# Patient Record
Sex: Female | Born: 1985 | Race: White | Hispanic: No | Marital: Married | Smoking: Never smoker
Health system: Southern US, Community
[De-identification: ages and names within clinical notes are randomized; demographics above are authoritative.]

## PROBLEM LIST (undated history)

## (undated) DIAGNOSIS — F32A Depression, unspecified: Secondary | ICD-10-CM

## (undated) DIAGNOSIS — E039 Hypothyroidism, unspecified: Secondary | ICD-10-CM

## (undated) DIAGNOSIS — F329 Major depressive disorder, single episode, unspecified: Secondary | ICD-10-CM

## (undated) DIAGNOSIS — F419 Anxiety disorder, unspecified: Secondary | ICD-10-CM

## (undated) HISTORY — DX: Hypothyroidism, unspecified: E03.9

## (undated) HISTORY — PX: APPENDECTOMY: SHX54

## (undated) HISTORY — DX: Morbid (severe) obesity due to excess calories: E66.01

---

## 2007-05-20 ENCOUNTER — Encounter: Payer: Self-pay | Admitting: Family Medicine

## 2007-06-17 ENCOUNTER — Ambulatory Visit: Payer: Self-pay | Admitting: Family Medicine

## 2007-06-17 DIAGNOSIS — E039 Hypothyroidism, unspecified: Secondary | ICD-10-CM

## 2007-07-06 ENCOUNTER — Ambulatory Visit: Payer: Self-pay | Admitting: Family Medicine

## 2007-07-07 ENCOUNTER — Encounter (INDEPENDENT_AMBULATORY_CARE_PROVIDER_SITE_OTHER): Payer: Self-pay | Admitting: *Deleted

## 2007-07-07 LAB — CONVERTED CEMR LAB
BUN: 11 mg/dL (ref 6–23)
CO2: 24 meq/L (ref 19–32)
Calcium: 9.2 mg/dL (ref 8.4–10.5)
Creatinine, Ser: 0.8 mg/dL (ref 0.4–1.2)
Direct LDL: 162.9 mg/dL
T3, Free: 4 pg/mL (ref 2.3–4.2)
TSH: 2.62 microintl units/mL (ref 0.35–5.50)

## 2007-07-12 ENCOUNTER — Telehealth (INDEPENDENT_AMBULATORY_CARE_PROVIDER_SITE_OTHER): Payer: Self-pay | Admitting: *Deleted

## 2007-07-13 ENCOUNTER — Ambulatory Visit: Payer: Self-pay | Admitting: Family Medicine

## 2007-07-15 LAB — CONVERTED CEMR LAB: Hgb A1c MFr Bld: 4.8 % (ref 4.6–6.0)

## 2008-07-23 ENCOUNTER — Telehealth (INDEPENDENT_AMBULATORY_CARE_PROVIDER_SITE_OTHER): Payer: Self-pay | Admitting: *Deleted

## 2008-07-31 ENCOUNTER — Ambulatory Visit: Payer: Self-pay | Admitting: Family Medicine

## 2008-08-08 ENCOUNTER — Ambulatory Visit: Payer: Self-pay | Admitting: Family Medicine

## 2008-08-08 LAB — CONVERTED CEMR LAB
Basophils Absolute: 0.1 10*3/uL (ref 0.0–0.1)
Basophils Relative: 0.8 % (ref 0.0–3.0)
CO2: 25 meq/L (ref 19–32)
Calcium: 8.6 mg/dL (ref 8.4–10.5)
Cholesterol: 180 mg/dL (ref 0–200)
Creatinine, Ser: 0.7 mg/dL (ref 0.4–1.2)
Eosinophils Absolute: 0.3 10*3/uL (ref 0.0–0.7)
Free T4: 0.9 ng/dL (ref 0.6–1.6)
Hemoglobin: 14.5 g/dL (ref 12.0–15.0)
Lymphocytes Relative: 41.7 % (ref 12.0–46.0)
MCHC: 35.4 g/dL (ref 30.0–36.0)
MCV: 86.4 fL (ref 78.0–100.0)
Neutro Abs: 4.3 10*3/uL (ref 1.4–7.7)
RBC: 4.75 M/uL (ref 3.87–5.11)

## 2008-10-10 ENCOUNTER — Ambulatory Visit: Payer: Self-pay | Admitting: Family Medicine

## 2008-10-12 ENCOUNTER — Ambulatory Visit: Payer: Self-pay | Admitting: Family Medicine

## 2008-10-23 ENCOUNTER — Encounter (INDEPENDENT_AMBULATORY_CARE_PROVIDER_SITE_OTHER): Payer: Self-pay | Admitting: *Deleted

## 2008-11-09 ENCOUNTER — Telehealth (INDEPENDENT_AMBULATORY_CARE_PROVIDER_SITE_OTHER): Payer: Self-pay | Admitting: *Deleted

## 2009-06-12 LAB — CONVERTED CEMR LAB: Pap Smear: NORMAL

## 2009-11-20 ENCOUNTER — Telehealth (INDEPENDENT_AMBULATORY_CARE_PROVIDER_SITE_OTHER): Payer: Self-pay | Admitting: *Deleted

## 2009-12-16 ENCOUNTER — Ambulatory Visit: Payer: Self-pay | Admitting: Family Medicine

## 2009-12-16 DIAGNOSIS — D239 Other benign neoplasm of skin, unspecified: Secondary | ICD-10-CM | POA: Insufficient documentation

## 2009-12-23 ENCOUNTER — Ambulatory Visit: Payer: Self-pay | Admitting: Family Medicine

## 2009-12-23 LAB — CONVERTED CEMR LAB
Bilirubin Urine: NEGATIVE
Glucose, Urine, Semiquant: NEGATIVE
Ketones, urine, test strip: NEGATIVE
Nitrite: NEGATIVE
Specific Gravity, Urine: <1.005
pH: 7

## 2009-12-24 LAB — CONVERTED CEMR LAB
Alkaline Phosphatase: 59 units/L (ref 39–117)
BUN: 8 mg/dL (ref 6–23)
Basophils Absolute: 0.1 10*3/uL (ref 0.0–0.1)
Bilirubin, Direct: 0 mg/dL (ref 0.0–0.3)
CO2: 26 meq/L (ref 19–32)
Chloride: 105 meq/L (ref 96–112)
Free T4: 1.3 ng/dL (ref 0.6–1.6)
Glucose, Bld: 74 mg/dL (ref 70–99)
HCT: 42.8 % (ref 36.0–46.0)
Hemoglobin: 14.9 g/dL (ref 12.0–15.0)
Lymphs Abs: 2.9 10*3/uL (ref 0.7–4.0)
MCV: 86.8 fL (ref 78.0–100.0)
Monocytes Absolute: 0.7 10*3/uL (ref 0.1–1.0)
Neutro Abs: 5.7 10*3/uL (ref 1.4–7.7)
Platelets: 406 10*3/uL — ABNORMAL HIGH (ref 150.0–400.0)
Potassium: 3.7 meq/L (ref 3.5–5.1)
RDW: 13 % (ref 11.5–14.6)
Total Bilirubin: 0.6 mg/dL (ref 0.3–1.2)

## 2009-12-25 ENCOUNTER — Encounter: Payer: Self-pay | Admitting: Family Medicine

## 2010-01-06 ENCOUNTER — Ambulatory Visit: Payer: Self-pay | Admitting: Family Medicine

## 2010-01-06 DIAGNOSIS — E785 Hyperlipidemia, unspecified: Secondary | ICD-10-CM

## 2010-04-08 ENCOUNTER — Ambulatory Visit: Payer: Self-pay | Admitting: Family Medicine

## 2010-04-09 LAB — CONVERTED CEMR LAB
ALT: 17 units/L (ref 0–35)
Albumin: 3.6 g/dL (ref 3.5–5.2)
Alkaline Phosphatase: 58 units/L (ref 39–117)
Total Protein: 6.7 g/dL (ref 6.0–8.3)

## 2010-04-22 ENCOUNTER — Telehealth (INDEPENDENT_AMBULATORY_CARE_PROVIDER_SITE_OTHER): Payer: Self-pay | Admitting: *Deleted

## 2010-04-24 ENCOUNTER — Telehealth: Payer: Self-pay | Admitting: Family Medicine

## 2010-08-20 ENCOUNTER — Ambulatory Visit: Payer: Self-pay | Admitting: Family Medicine

## 2010-08-20 ENCOUNTER — Encounter: Payer: Self-pay | Admitting: Family Medicine

## 2010-08-26 ENCOUNTER — Telehealth (INDEPENDENT_AMBULATORY_CARE_PROVIDER_SITE_OTHER): Payer: Self-pay | Admitting: *Deleted

## 2010-09-05 ENCOUNTER — Telehealth (INDEPENDENT_AMBULATORY_CARE_PROVIDER_SITE_OTHER): Payer: Self-pay | Admitting: *Deleted

## 2010-09-30 NOTE — Assessment & Plan Note (Signed)
Summary: roa for lab work results//lch   Vital Signs:  Patient profile:   25 year old female Weight:      250 pounds Pulse rate:   76 / minute Pulse rhythm:   regular BP sitting:   122 / 86  (left arm) Cuff size:   large  Vitals Entered By: Army Fossa CMA (Jan 06, 2010 4:33 PM) CC: Pt here to discuss NMR   History of Present Illness: Pt here to discuss NMR only.  No complaints.   Current Medications (verified): 1)  Synthroid 112 Mcg Tabs (Levothyroxine Sodium) .[name].. 1 By Mouth Once Daily 2)  Mircette 0.15-0.02/0.01 Mg (21/5)  Tabs (Desogestrel-Ethinyl Estradiol) .... As Directed 3)  Zocor 40 Mg Tabs (Simvastatin) .[name].. 1 By Mouth At Bedtime  Allergies: 1)  ! Lidocaine  Past History:  Past medical, surgical, family and social histories (including risk factors) reviewed for relevance to current acute and chronic problems.  Past Medical History: Hypothyroidism Hyperlipidemia  Past Surgical History: Reviewed history from 06/17/2007 and no changes required. Appendectomy: AGE 1   Family History: Reviewed history from 06/17/2007 and no changes required. MOTHER;LIVING FATHER:LIVING 2 BROTHERS:LIVING HEART: UNCLE (FATHER'S SIDE) Family History Hypertension: MOTHER, FATHER THROAT CANCER: PGM  Social History: Reviewed history from 06/17/2007 and no changes required. Occupation: Product/process development scientist- [employer] Married Never Smoked Alcohol use-yes Drug use-no Regular exercise-yes  Review of Systems      See HPI  Physical Exam  General:  Well-developed,well-nourished,in no acute distress; alert,appropriate and cooperative throughout examination Psych:  Oriented X3 and normally interactive.     Impression & Recommendations:  Problem # 1:  HYPERLIPIDEMIA (ICD-272.4) NMR reviewed with pt  Her updated medication list for this problem includes:    Zocor 40 Mg Tabs (Simvastatin) .[name]... 1 by mouth at bedtime  Labs Reviewed: SGOT: 23 (12/23/2009)   SGPT: 23 (12/23/2009)  HDL:31.3 (07/31/2008), 30.5 (07/06/2007)  LDL:116 (07/31/2008), DEL (07/06/2007)  Chol:180 (07/31/2008), 206 (07/06/2007)  Trig:162 (07/31/2008), 122 (07/06/2007)  Complete Medication List: 1)  Synthroid 112 Mcg Tabs (Levothyroxine sodium) .[name].. 1 by mouth once daily 2)  Mircette 0.15-0.02/0.01 Mg (21/5) Tabs (Desogestrel-ethinyl estradiol) .... As directed 3)  Zocor 40 Mg Tabs (Simvastatin) .[name].. 1 by mouth at bedtime  Patient Instructions: 1)  fasting labs 3 months----NMR, hep 272.4 Prescriptions: ZOCOR 40 MG TABS (SIMVASTATIN) 1 by mouth at bedtime  #30 x 2   Entered and Authorized by:   Loreen Freud DO   Signed by:   Loreen Freud DO on 01/06/2010   Method used:   Electronically to        [facility]. # [phone]* (retail)       [address]       [place], [place]  [address]       Ph: [phone]       Fax: [phone]   RxID:   [id number]

## 2010-09-30 NOTE — Assessment & Plan Note (Signed)
Summary: cpx//pt will be fasting//lch   Vital Signs:  Patient profile:   25 year old female Height:      68.75 inches Weight:      251 pounds BMI:     37.47 Pulse rate:   76 / minute Pulse rhythm:   regular BP sitting:   130 / 82  (left arm) Cuff size:   large  Vitals Entered By: Army Fossa CMA (December 16, 2009 8:50 AM) CC: Pt here for CPX- no pap has a gyn. Pt is not fasting.   History of Present Illness: Pt here for cpe --- no pap-- pt has gyn.  Pt was told by gyn that by next year if she has not lost any weight they will stop bcp.     Preventive Screening-Counseling & Management  Alcohol-Tobacco     Alcohol drinks/day: <1     Smoking Status: never  Caffeine-Diet-Exercise     Caffeine use/day: 2     Diet Comments: working on      Does Patient Exercise: yes     Type of exercise: aerobic     Exercise (avg: min/session): 30-60     Times/week: <3     Exercise Counseling: to improve exercise regimen  Hep-HIV-STD-Contraception     HIV Risk: no     Dental Visit-last 6 months yes     Dental Care Counseling: to seek dental care; no dental care within six months     SBE monthly: yes     SBE Education/Counseling: not indicated; SBE done regularly  Safety-Violence-Falls     Seat Belt Use: 100      Sexual History:  currently monogamous and married.    Current Medications (verified): 1)  Synthroid 112 Mcg Tabs (Levothyroxine Sodium) .[name].. 1 By Mouth Once Daily 2)  Mircette 0.15-0.02/0.01 Mg (21/5)  Tabs (Desogestrel-Ethinyl Estradiol) .... As Directed  Allergies: 1)  ! Lidocaine  Past History:  Past Medical History: Last updated: 06/17/2007 Hypothyroidism  Past Surgical History: Last updated: 06/17/2007 Appendectomy: AGE 89   Family History: Last updated: 06/17/2007 MOTHER;LIVING FATHER:LIVING 2 BROTHERS:LIVING HEART: UNCLE (FATHER'S SIDE) Family History Hypertension: MOTHER, FATHER THROAT CANCER: PGM  Social History: Last updated:  06/17/2007 Occupation: auditor- [employer] Married Never Smoked Alcohol use-yes Drug use-no Regular exercise-yes  Risk Factors: Alcohol Use: <1 (12/16/2009) Caffeine Use: 2 (12/16/2009) Diet: working on  (12/16/2009) Exercise: yes (12/16/2009)  Risk Factors: Smoking Status: never (12/16/2009) Passive Smoke Exposure: no (06/17/2007)  Family History: Reviewed history from 06/17/2007 and no changes required. MOTHER;LIVING FATHER:LIVING 2 BROTHERS:LIVING HEART: UNCLE (FATHER'S SIDE) Family History Hypertension: MOTHER, FATHER THROAT CANCER: PGM  Social History: Reviewed history from 06/17/2007 and no changes required. 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 Eyes:  Denies blurring, discharge, double vision, eye irritation, eye pain, halos, itching, light sensitivity, red eye, vision loss-1 eye, and vision loss-both eyes; optho--q2y. ENT:  Denies decreased hearing, difficulty swallowing, ear discharge, earache, hoarseness, nasal congestion, nosebleeds, postnasal drainage, ringing in ears, sinus pressure, and sore throat. CV:  Denies bluish discoloration of lips or nails, chest pain or discomfort, difficulty breathing at night, difficulty breathing while lying down, fainting, fatigue, leg cramps with exertion, lightheadness, near fainting, palpitations, shortness of breath with exertion, swelling  of feet, swelling of hands, and weight gain. Resp:  Denies chest discomfort, chest pain with inspiration, cough, coughing up blood, excessive snoring, hypersomnolence, morning headaches, pleuritic, shortness of breath, sputum productive, and wheezing. GI:  Denies abdominal pain, bloody stools, change in  bowel habits, constipation, dark tarry stools, diarrhea, excessive appetite, gas, hemorrhoids, indigestion, loss of appetite, nausea, vomiting, vomiting blood, and yellowish skin color. GU:  Denies abnormal vaginal bleeding, decreased libido, discharge, dysuria, genital sores, hematuria, incontinence, nocturia, urinary frequency, and urinary hesitancy. MS:  Denies joint pain, joint redness, joint swelling, loss of strength, low back pain, mid back pain, muscle aches, muscle , cramps, muscle weakness, stiffness, and thoracic pain. Derm:  Denies changes in color of skin, changes in nail beds, dryness, excessive perspiration, flushing, hair loss, insect bite(s), itching, lesion(s), poor wound healing, and rash. Neuro:  Denies brief paralysis, difficulty with concentration, disturbances in coordination, falling down, headaches, inability to speak, memory loss, numbness, poor balance, seizures, sensation of room spinning, tingling, tremors, visual disturbances, and weakness. Psych:  Denies alternate hallucination ( auditory/visual), anxiety, depression, easily angered, easily tearful, irritability, mental problems, panic attacks, sense of great danger, suicidal thoughts/plans, thoughts of violence, unusual visions or sounds, and thoughts /plans of harming others. Endo:  Denies cold intolerance, excessive hunger, excessive thirst, excessive urination, heat intolerance, polyuria, and weight change. Heme:  Denies abnormal bruising, bleeding, enlarge lymph nodes, fevers, pallor, and skin discoloration. Allergy:  Denies hives or rash, itching eyes, persistent infections, seasonal allergies, and sneezing.  Physical Exam  General:  Well-developed,well-nourished,in no acute distress; alert,appropriate and cooperative throughout examination overweight-appearing.   Head:  Normocephalic and atraumatic without obvious abnormalities. No apparent alopecia or balding. Eyes:  vision grossly intact, pupils equal, pupils  round, pupils reactive to light, and no injection.   Ears:  External ear exam shows no significant lesions or deformities.  Otoscopic examination reveals clear canals, tympanic membranes are intact bilaterally without bulging, retraction, inflammation or discharge. Hearing is grossly normal bilaterally. Nose:  External nasal examination shows no deformity or inflammation. Nasal mucosa are pink and moist without lesions or exudates. Mouth:  Oral mucosa and oropharynx without lesions or exudates.  Teeth in good repair. Neck:  No deformities, masses, or tenderness noted. Breasts:  gyn Lungs:  Normal respiratory effort, chest expands symmetrically. Lungs are clear to auscultation, no crackles or wheezes. Heart:  normal rate and no murmur.   Abdomen:  Bowel sounds positive,abdomen soft and non-tender without masses, organomegaly or hernias noted. Genitalia:  gyn Msk:  normal ROM, no joint tenderness, no joint swelling, no joint warmth, no redness over joints, no joint deformities, no joint instability, and no crepitation.   Pulses:  R popliteal normal, R posterior tibial normal, R dorsalis pedis normal, L posterior tibial normal, L dorsalis pedis normal, and L carotid normal.   Extremities:  No clubbing, cyanosis, edema, or deformity noted with normal full range of motion of all joints.   Neurologic:  No cranial nerve deficits noted. Station and gait are normal. Plantar reflexes are down-going bilaterally. DTRs are symmetrical throughout. Sensory, motor and coordinative functions appear intact. Skin:  Intact without suspicious lesions or rashes--- mult moles Cervical Nodes:  No lymphadenopathy noted Axillary Nodes:  No palpable lymphadenopathy Psych:  Cognition and judgment appear intact. Alert and cooperative with normal attention span and concentration. No apparent delusions, illusions, hallucinations   Impression & Recommendations:  Problem # 1:  PREVENTIVE HEALTH CARE (ICD-V70.0) GHM utd check  fasting labs pap per gyn  Problem # 2:  MORBID OBESITY (ICD-278.01) Encourage diet and exercise refer to nutrition Ht: 68.75 (12/16/2009)   Wt: 251 (12/16/2009)   BMI: 37.47 (12/16/2009)  Problem # 3:  NEVI, MULTIPLE (ICD-216.9)  Orders: Dermatology Referral (Derma)  Problem # 4:  HYPOTHYROIDISM (ICD-244.9)  Her updated medication list for this problem includes:    Synthroid 112 Mcg Tabs (Levothyroxine sodium) .[name]... 1 by mouth once daily  Labs Reviewed: TSH: 3.11 (10/12/2008)    HgBA1c: 4.8 (07/13/2007) Chol: 180 (07/31/2008)   HDL: 31.3 (07/31/2008)   LDL: 116 (07/31/2008)   TG: 162 (07/31/2008)  Complete Medication List: 1)  Synthroid 112 Mcg Tabs (Levothyroxine sodium) .[name].. 1 by mouth once daily 2)  Mircette 0.15-0.02/0.01 Mg (21/5) Tabs (Desogestrel-ethinyl estradiol) .... As directed  Other Orders: Nutrition Referral (Nutrition)  Patient Instructions: 1)  V70.0 244.9   hep, NMR, bmp, TSH,  CBCD  free T3,  Free T4--fasting, UA Prescriptions: SYNTHROID 112 MCG TABS (LEVOTHYROXINE SODIUM) 1 by mouth once daily  #30 x 11   Entered and Authorized by:   Loreen Freud DO   Signed by:   Loreen Freud DO on 12/16/2009   Method used:   Print then Give to Patient   RxID:   [id number]     Flu Vaccine Next Due:  Refused Last PAP:  Normal (05/11/2007 2:54:19 PM) PAP Result Date:  06/12/2009 PAP Result:  normal PAP Next Due:  1 yr

## 2010-09-30 NOTE — Consult Note (Signed)
Summary: Duard Larsen MD Dermatology  Duard Larsen MD Dermatology   Imported By: Lanelle Bal 02/21/2010 13:28:13  _____________________________________________________________________  External Attachment:    Type:   Image     Comment:   External Document

## 2010-09-30 NOTE — Progress Notes (Signed)
Summary: Refill Request  Phone Note Refill Request Message from:  Patient on [facility] on [address] in [place]   Refills Requested: Medication #1:  SYNTHROID 112 MCG TABS 1 by mouth once daily   Dosage confirmed as above?Dosage Confirmed   Supply Requested: 1 month Patient just needs a month refill until she come in for cpx in april.  Next Appointment Scheduled: 4.18.11 Initial call taken by: Harold Barban,  November 20, 2009 8:19 AM    Prescriptions: SYNTHROID 112 MCG TABS (LEVOTHYROXINE SODIUM) 1 by mouth once daily  #30 x 0   Entered by:   Army Fossa CMA   Authorized by:   Loreen Freud DO   Signed by:   Army Fossa CMA on 11/20/2009   Method used:   Electronically to        [facility]. # [phone]* (retail)       [address]       [place], [place]  [address]       Ph: [phone]       Fax: [phone]   RxID:   [id number]

## 2010-09-30 NOTE — Progress Notes (Signed)
Summary: lab result  Phone Note Outgoing Call   Call placed by: [facility] CMA,  April 22, 2010 8:50 AM Details for Reason: Small LDL particles high---con't zocor and add trilipix135 mg #30  1 by mouth once daily , 2 refills---give savings card with rx.   Recheck labs 3 months----NMR, hep 272.4 Summary of Call: left message to call office.................Marland KitchenFelecia Deloach CMA  April 22, 2010 8:49 AM  left message to call OFFICE.................Marland KitchenFelecia Deloach CMA  April 23, 2010 10:00 AM  DISCUSS WITH PATIENT, lab and discount card mailed, rx sent to pharmacy............Marland KitchenFelecia Deloach CMA  April 24, 2010 9:46 AM     New/Updated Medications: TRILIPIX 135 MG CPDR (CHOLINE FENOFIBRATE) Take 1 by mouth once daily Prescriptions: TRILIPIX 135 MG CPDR (CHOLINE FENOFIBRATE) Take 1 by mouth once daily  #30 x 2   Entered by:   Jeremy Johann CMA   Authorized by:   Loreen Freud DO   Signed by:   Jeremy Johann CMA on 04/24/2010   Method used:   Faxed to ...       [facility] # [phone]* (retail)       [address]       [place], [place]  [address]       Ph: [phone]       Fax: [phone]   RxID:   [id number]

## 2010-09-30 NOTE — Progress Notes (Signed)
Summary: RX REQUEST  Phone Note From Pharmacy   Caller: [facility] [address] # [phone]* Summary of Call: PT Holly Singleton HAS AN RX FOR TRILIPIX 135 M,G. THE INS REJECTED THE CLAIM STATING PT SHOULD TRY GENERIC FIRST. PLEASE MAKE THE CHANGE OR MAYBE YOU CAN INITIATE PRIOR AUTH BY CALLING [phone] PT ID IS [id number] Initial call taken by: Lavell Islam,  April 24, 2010 12:03 PM  Follow-up for Phone Call        pls advise................Marland KitchenFelecia Deloach CMA  April 24, 2010 1:14 PM   Additional Follow-up for Phone Call Additional follow up Details #1::        fenofibrate 160 mg #30  1 by mouth once daily 2 refills Additional Follow-up by: Loreen Freud DO,  April 24, 2010 1:19 PM    New/Updated Medications: FENOFIBRATE 160 MG TABS (FENOFIBRATE) 1 by mouth once daily Prescriptions: FENOFIBRATE 160 MG TABS (FENOFIBRATE) 1 by mouth once daily  #30 x 2   Entered by:   Almeta Monas CMA (AAMA)   Authorized by:   Loreen Freud DO   Signed by:   Almeta Monas CMA (AAMA) on 04/24/2010   Method used:   Faxed to ...       [facility] # [phone]* (retail)       [address]       [place], [place]  [address]       Ph: [phone]       Fax: [phone]   RxID:   [id number]  Rx faxed and pt is aware.    Almeta Monas CMA Duncan Dull)  April 24, 2010 4:55 PM

## 2010-10-02 LAB — CONVERTED CEMR LAB
ALT: 48 units/L — ABNORMAL HIGH (ref 0–35)
AST: 39 units/L — ABNORMAL HIGH (ref 0–37)
Albumin: 3.9 g/dL (ref 3.5–5.2)
Total Bilirubin: 0.8 mg/dL (ref 0.3–1.2)
Total Protein: 6.7 g/dL (ref 6.0–8.3)

## 2010-10-02 NOTE — Progress Notes (Signed)
Summary: Results 12/27  Phone Note Outgoing Call   Call placed by: Almeta Monas CMA Duncan Dull),  August 26, 2010 2:03 PM Call placed to: Patient Details for Reason: slightly elevated LFT----- any ETOH or extra tylenol?   Recheck 2 weeks---790.4  hep, GGT,  Summary of Call: Left message to call back..... Almeta Monas CMA Duncan Dull)  August 26, 2010 2:03 PM   Follow-up for Phone Call        spoke w/ patient aware of labs and will callback to scheduled appt...Marland KitchenMarland KitchenDoristine Devoid CMA  August 26, 2010 4:40 PM

## 2010-10-02 NOTE — Progress Notes (Signed)
Summary: Results--lmom 1/6  Phone Note Outgoing Call   Call placed by: Almeta Monas CMA Duncan Dull),  September 05, 2010 4:42 PM Call placed to: Patient Details for Reason: Pt LDL particles very high-- is pt taking meds?  Does not look like she is taking statin. If she is she needs something stronger---Lipitor 20 mg  #30 1 by mouth at bedtime ,  2 refills------ cont fenofibrate recheck 3 months----272.4   [facility], bmp ----- we will schedule ov when labs come in  Summary of Call: Left message to call back..... Almeta Monas CMA Duncan Dull)  September 05, 2010 4:41 PM    Follow-up for Phone Call        Discuss with patient , rx sent to pharmacy.Marti Sleigh Deloach CMA  September 05, 2010 5:11 PM     Prescriptions: FENOFIBRATE 160 MG TABS (FENOFIBRATE) 1 by mouth once daily  #30 x 2   Entered by:   Jeremy Johann CMA   Authorized by:   Loreen Freud DO   Signed by:   Jeremy Johann CMA on 09/05/2010   Method used:   Faxed to ...       [facility] # [phone]* (retail)       [address]       [place], [place]  [address]       Ph: [phone]       Fax: [phone]   RxID:   [id number] ZOCOR 40 MG TABS (SIMVASTATIN) 1 by mouth at bedtime  #30 x 2   Entered by:   Jeremy Johann CMA   Authorized by:   Loreen Freud DO   Signed by:   Jeremy Johann CMA on 09/05/2010   Method used:   Faxed to ...       [facility] # [phone]* (retail)       [address]       [place], [place]  [address]       Ph: [phone]       Fax: [phone]   RxID:   [id number]

## 2011-01-06 ENCOUNTER — Other Ambulatory Visit: Payer: Self-pay | Admitting: Family Medicine

## 2011-01-12 ENCOUNTER — Encounter: Payer: Self-pay | Admitting: Family Medicine

## 2011-01-12 ENCOUNTER — Ambulatory Visit (INDEPENDENT_AMBULATORY_CARE_PROVIDER_SITE_OTHER): Payer: PRIVATE HEALTH INSURANCE | Admitting: Family Medicine

## 2011-01-12 VITALS — BP 120/80 | HR 86 | Temp 98.7°F | Ht 68.5 in | Wt 260.0 lb

## 2011-01-12 DIAGNOSIS — Z0000 Encounter for general adult medical examination without abnormal findings: Secondary | ICD-10-CM | POA: Insufficient documentation

## 2011-01-12 DIAGNOSIS — E785 Hyperlipidemia, unspecified: Secondary | ICD-10-CM

## 2011-01-12 DIAGNOSIS — E039 Hypothyroidism, unspecified: Secondary | ICD-10-CM | POA: Insufficient documentation

## 2011-01-12 MED ORDER — LEVOTHYROXINE SODIUM 112 MCG PO TABS: 112.0000 ug | ORAL_TABLET | Freq: Every day | ORAL | Status: DC

## 2011-01-12 MED ORDER — SIMVASTATIN 40 MG PO TABS: 40.0000 mg | ORAL_TABLET | Freq: Every day | ORAL | Status: DC

## 2011-01-12 MED ORDER — FENOFIBRATE 160 MG PO TABS: 160.0000 mg | ORAL_TABLET | Freq: Every day | ORAL | Status: DC

## 2011-01-12 NOTE — Assessment & Plan Note (Signed)
Reviewed preventive care protocols, scheduled due services, and updated immunizations Discussed nutrition, exercise, diet, and healthy lifestyle.  Future lab orders placed.

## 2011-01-12 NOTE — Patient Instructions (Signed)
Please stop by to see Holly Singleton on your way out. Please make an appointment for a fasting lab visiti in 2-3 months.

## 2011-01-12 NOTE — Progress Notes (Signed)
25 yo here to establish care and for CPX.  Pt has a GYN.  Has appt next month. Former pt of Dr. Ernst Spell.  HLD-  Liposcience profile done in January. On Simvastatin 40 mg daily and Fenofibrate 160 mg daily. Using condoms for [facility], aware that statins are dangerous in pregnancy. Cannot use OCPs due to her weight.  Trying to exercise more but continues to gain weight. Wt Readings from Last 3 Encounters:  01/12/11 260 lb (117.935 kg)  01/06/10 250 lb (113.399 kg)  12/16/09 251 lb (113.853 kg)   Ready to get some help.  Hypothyroidism- On Synthroid 112 mcg daily.  Denies any symptoms of hypo or hyperthyroidism.    The PMH, PSH, Social History, Family History, Medications, and allergies have been reviewed in [facility], and have been updated if relevant.   Review of Systems       See HPI Patient reports no vision/ hearing  changes, adenopathy,fever, weight change,  persistant / recurrent hoarseness , swallowing issues, chest pain,palpitations,edema,persistant /recurrent cough, hemoptysis, dyspnea( rest/ exertional/paroxysmal nocturnal), gastrointestinal bleeding(melena, rectal bleeding), abdominal pain, significant heartburn boel changes,GU symptoms(dysuria, hematuria,pyuria, incontinence) ), Gyn symptoms(abnormal  bleeding , pain),  syncope, focal weakness, memory loss,numbness & tingling, skin/hair /nail changes,abnormal bruising or bleeding, anxiety,or depression.   Physical Exam BP 120/80  Pulse 86  Temp(Src) 98.7 F (37.1 C) (Oral)  Ht 5' 8.5" (1.74 m)  Wt 260 lb (117.935 kg)  BMI 38.96 kg/m2  LMP 12/09/2010  General:  Well-developed,well-nourished,in no acute distress; alert,appropriate and cooperative throughout examination overweight-appearing.   Head:  Normocephalic and atraumatic without obvious abnormalities. No apparent alopecia or balding. Eyes:  vision grossly intact, pupils equal, pupils round, pupils reactive to light, and no injection.   Ears:  External ear exam shows no  significant lesions or deformities.  Otoscopic examination reveals clear canals, tympanic membranes are intact bilaterally without bulging, retraction, inflammation or discharge. Hearing is grossly normal bilaterally. Nose:  External nasal examination shows no deformity or inflammation. Nasal mucosa are pink and moist without lesions or exudates. Mouth:  Oral mucosa and oropharynx without lesions or exudates.  Teeth in good repair. Neck:  No deformities, masses, or tenderness noted. Breasts:  gyn Lungs:  Normal respiratory effort, chest expands symmetrically. Lungs are clear to auscultation, no crackles or wheezes. Heart:  normal rate and no murmur.   Abdomen:  Bowel sounds positive,abdomen soft and non-tender without masses, organomegaly or hernias noted. Genitalia:  gyn Msk:  normal ROM, no joint tenderness, no joint swelling, no joint warmth, no redness over joints, no joint deformities, no joint instability, and no crepitation.   Pulses:  R popliteal normal, R posterior tibial normal, R dorsalis pedis normal, L posterior tibial normal, L dorsalis pedis normal, and L carotid normal.   Extremities:  No clubbing, cyanosis, edema, or deformity noted with normal full range of motion of all joints.   Neurologic:  No cranial nerve deficits noted. Station and gait are normal. Plantar reflexes are down-going bilaterally. DTRs are symmetrical throughout. Sensory, motor and coordinative functions appear intact. Skin:  Intact without suspicious lesions or rashes--- mult moles Cervical Nodes:  No lymphadenopathy noted Axillary Nodes:  No palpable lymphadenopathy Psych:  Cognition and judgment appear intact. Alert and cooperative with normal attention span and concentration. No apparent delusions, illusions, hallucinations

## 2011-01-12 NOTE — Assessment & Plan Note (Signed)
Deteriorated. Will refer to nutritionist. Order placed.

## 2011-01-22 ENCOUNTER — Encounter: Payer: Self-pay | Admitting: Family Medicine

## 2011-01-23 ENCOUNTER — Encounter: Payer: Self-pay | Admitting: Family Medicine

## 2011-02-16 ENCOUNTER — Ambulatory Visit: Payer: Self-pay | Admitting: Family Medicine

## 2011-03-01 ENCOUNTER — Ambulatory Visit: Payer: Self-pay | Admitting: Family Medicine

## 2011-03-24 ENCOUNTER — Other Ambulatory Visit: Payer: PRIVATE HEALTH INSURANCE

## 2011-03-25 ENCOUNTER — Other Ambulatory Visit (INDEPENDENT_AMBULATORY_CARE_PROVIDER_SITE_OTHER): Payer: PRIVATE HEALTH INSURANCE | Admitting: Family Medicine

## 2011-03-25 DIAGNOSIS — E785 Hyperlipidemia, unspecified: Secondary | ICD-10-CM

## 2011-03-25 DIAGNOSIS — E039 Hypothyroidism, unspecified: Secondary | ICD-10-CM

## 2011-03-25 LAB — HEPATIC FUNCTION PANEL
Alkaline Phosphatase: 47 U/L (ref 39–117)
Bilirubin, Direct: 0 mg/dL (ref 0.0–0.3)

## 2011-03-25 LAB — LIPID PANEL
LDL Cholesterol: 76 mg/dL (ref 0–99)
Total CHOL/HDL Ratio: 3

## 2011-04-01 ENCOUNTER — Ambulatory Visit: Payer: Self-pay | Admitting: Family Medicine

## 2011-05-02 ENCOUNTER — Ambulatory Visit: Payer: Self-pay | Admitting: Family Medicine

## 2011-06-10 ENCOUNTER — Ambulatory Visit: Payer: Self-pay | Admitting: Family Medicine

## 2011-07-02 ENCOUNTER — Ambulatory Visit: Payer: Self-pay | Admitting: Family Medicine

## 2011-07-16 ENCOUNTER — Other Ambulatory Visit: Payer: Self-pay | Admitting: *Deleted

## 2011-07-16 MED ORDER — LEVOTHYROXINE SODIUM 112 MCG PO TABS: 112.0000 ug | ORAL_TABLET | Freq: Every day | ORAL | Status: DC

## 2011-08-01 ENCOUNTER — Ambulatory Visit: Payer: Self-pay | Admitting: Family Medicine

## 2011-09-17 ENCOUNTER — Encounter: Payer: Self-pay | Admitting: Family Medicine

## 2011-09-17 ENCOUNTER — Ambulatory Visit: Payer: PRIVATE HEALTH INSURANCE | Admitting: Family Medicine

## 2011-09-17 ENCOUNTER — Ambulatory Visit (INDEPENDENT_AMBULATORY_CARE_PROVIDER_SITE_OTHER): Payer: PRIVATE HEALTH INSURANCE | Admitting: Family Medicine

## 2011-09-17 VITALS — BP 130/80 | HR 68 | Temp 98.1°F | Wt 229.2 lb

## 2011-09-17 DIAGNOSIS — IMO0002 Reserved for concepts with insufficient information to code with codable children: Secondary | ICD-10-CM

## 2011-09-17 DIAGNOSIS — S86899A Other injury of other muscle(s) and tendon(s) at lower leg level, unspecified leg, initial encounter: Secondary | ICD-10-CM | POA: Insufficient documentation

## 2011-09-17 NOTE — Progress Notes (Signed)
Subjective:    Patient ID: Holly Singleton, female    DOB: December 05, 1985, 26 y.o.   MRN: [id number]  HPI 26 yo here to discuss bilateral medial shin pain.  Has been running outside almost daily.  Lost 30 pounds!  Also seeing a nutritionist. Wt Readings from Last 3 Encounters:  09/17/11 229 lb 4 oz (103.987 kg)  01/12/11 260 lb (117.935 kg)  01/06/10 250 lb (113.399 kg)   No pain while running.  Usually sore in the evenings after she is done running. Ice typically helps.  No swelling or redness.  No LE weakness or pain with walking.  Patient Active Problem List  Diagnoses  . NEVI, MULTIPLE  . HYPOTHYROIDISM  . HYPERLIPIDEMIA  . MORBID OBESITY  . Hypothyroidism  . Routine general medical examination at a health care facility  . Shin splints   Past Medical History  Diagnosis Date  . Hypothyroidism   . Morbid obesity    Past Surgical History  Procedure Date  . Appendectomy    History  Substance Use Topics  . Smoking status: Never Smoker   . Smokeless tobacco: Not on file  . Alcohol Use: Not on file   Family History  Problem Relation Age of Onset  . Hypertension Mother   . Hypertension Father    Allergies  Allergen Reactions  . Lidocaine    Current Outpatient Prescriptions on File Prior to Visit  Medication Sig Dispense Refill  . fenofibrate 160 MG tablet Take 1 tablet (160 mg total) by mouth daily.  30 tablet  6  . levothyroxine (SYNTHROID, LEVOTHROID) 112 MCG tablet Take 1 tablet (112 mcg total) by mouth daily.  30 tablet  6  . simvastatin (ZOCOR) 40 MG tablet Take 1 tablet (40 mg total) by mouth at bedtime.  30 tablet  3   The PMH, PSH, Social History, Family History, Medications, and allergies have been reviewed in [facility], and have been updated if relevant.    Review of Systems See HPI    Objective:   Physical Exam BP 130/80  Pulse 68  Temp(Src) 98.1 F (36.7 C) (Oral)  Wt 229 lb 4 oz (103.987 kg)  LMP 08/31/2011  General:   Well-developed,well-nourished,in no acute distress; alert,appropriate and cooperative throughout examination Head:  normocephalic and atraumatic.   Eyes:  vision grossly intact, pupils equal, pupils round, and pupils reactive to light.   Ears:  R ear normal and L ear normal.   Nose:  no external deformity.   Mouth:  good dentition.   Neck:  No deformities, masses, or tenderness noted. Lungs:  Normal respiratory effort, chest expands symmetrically. Lungs are clear to auscultation, no crackles or wheezes. Heart:  Normal rate and regular rhythm. S1 and S2 normal without gallop, murmur, click, rub or other extra sounds. Abdomen:  Bowel sounds positive,abdomen soft and non-tender without masses, organomegaly or hernias noted. Msk:  No deformity or scoliosis noted of thoracic or lumbar spine.   Extremities:  No clubbing, cyanosis, edema, or deformity noted with normal full range of motion of all joints, non tender over tib/fib, no swelling.   Neurologic:  alert & oriented X3 and gait normal.   Skin:  Intact without suspicious lesions or rashes Psych:  Cognition and judgment appear intact. Alert and cooperative with normal attention span and concentration. No apparent delusions, illusions, hallucinations        Assessment & Plan:   1. Shin splints    New.   Discussed wearing supportive shoes, exercises and cross  training. Went over sports med advisor shin splints handout. The patient indicates understanding of these issues and agrees with the plan.

## 2011-09-17 NOTE — Patient Instructions (Signed)
Hand out given and discussed from Sports Medicine Advisor.

## 2012-01-08 ENCOUNTER — Other Ambulatory Visit: Payer: Self-pay | Admitting: Family Medicine

## 2012-01-08 DIAGNOSIS — E039 Hypothyroidism, unspecified: Secondary | ICD-10-CM

## 2012-01-08 DIAGNOSIS — Z0000 Encounter for general adult medical examination without abnormal findings: Secondary | ICD-10-CM

## 2012-01-08 DIAGNOSIS — E785 Hyperlipidemia, unspecified: Secondary | ICD-10-CM

## 2012-01-13 ENCOUNTER — Other Ambulatory Visit (INDEPENDENT_AMBULATORY_CARE_PROVIDER_SITE_OTHER): Payer: PRIVATE HEALTH INSURANCE

## 2012-01-13 DIAGNOSIS — E785 Hyperlipidemia, unspecified: Secondary | ICD-10-CM

## 2012-01-13 DIAGNOSIS — Z0000 Encounter for general adult medical examination without abnormal findings: Secondary | ICD-10-CM

## 2012-01-13 DIAGNOSIS — E039 Hypothyroidism, unspecified: Secondary | ICD-10-CM

## 2012-01-13 LAB — COMPREHENSIVE METABOLIC PANEL
ALT: 11 U/L (ref 0–35)
Albumin: 4.3 g/dL (ref 3.5–5.2)
CO2: 24 mEq/L (ref 19–32)
Calcium: 9.3 mg/dL (ref 8.4–10.5)
Chloride: 105 mEq/L (ref 96–112)
GFR: 76.95 mL/min (ref >60.00)
Sodium: 139 mEq/L (ref 135–145)
Total Protein: 7.2 g/dL (ref 6.0–8.3)

## 2012-01-13 LAB — LIPID PANEL
LDL Cholesterol: 75 mg/dL (ref 0–99)
Total CHOL/HDL Ratio: 3

## 2012-01-20 ENCOUNTER — Ambulatory Visit (INDEPENDENT_AMBULATORY_CARE_PROVIDER_SITE_OTHER): Payer: PRIVATE HEALTH INSURANCE | Admitting: Family Medicine

## 2012-01-20 ENCOUNTER — Encounter: Payer: Self-pay | Admitting: Family Medicine

## 2012-01-20 DIAGNOSIS — Z0000 Encounter for general adult medical examination without abnormal findings: Secondary | ICD-10-CM

## 2012-01-20 DIAGNOSIS — E785 Hyperlipidemia, unspecified: Secondary | ICD-10-CM

## 2012-01-20 DIAGNOSIS — E039 Hypothyroidism, unspecified: Secondary | ICD-10-CM

## 2012-01-20 NOTE — Progress Notes (Signed)
26 yo initially here for CPX but 30 minutes late so here for medication management.  Pt has a GYN- Dr. Vickey Singleton- 4 weeks ago, pap neg.    HLD- has lost 60 pounds since last year with diet and exercise!  Feels great.  She is very proud of how far she has come. On Simvastatin 40 mg daily and Fenofibrate 160 mg daily but ran out last month.  Wt Readings from Last 3 Encounters:  01/20/12 203 lb (92.08 kg)  09/17/11 229 lb 4 oz (103.987 kg)  01/12/11 260 lb (117.935 kg)   Wants to loose 20 more pounds. Lab Results  Component Value Date   CHOL 135 01/13/2012   HDL 46.70 01/13/2012   LDLCALC 75 01/13/2012   LDLDIRECT 162.9 07/06/2007   TRIG 67.0 01/13/2012   CHOLHDL 3 01/13/2012    Hypothyroidism- On Synthroid 112 mcg daily.  Denies any symptoms of hypo or hyperthyroidism.   Lab Results  Component Value Date   TSH 2.17 01/13/2012   Patient Active Problem List  Diagnoses  . NEVI, MULTIPLE  . HYPOTHYROIDISM  . HYPERLIPIDEMIA  . MORBID OBESITY  . Hypothyroidism  . Routine general medical examination at a health care facility  . Shin splints   Past Medical History  Diagnosis Date  . Hypothyroidism   . Morbid obesity    Past Surgical History  Procedure Date  . Appendectomy    History  Substance Use Topics  . Smoking status: Never Smoker   . Smokeless tobacco: Not on file  . Alcohol Use: Not on file   Family History  Problem Relation Age of Onset  . Hypertension Mother   . Hypertension Father    Allergies  Allergen Reactions  . Lidocaine    Current Outpatient Prescriptions on File Prior to Visit  Medication Sig Dispense Refill  . levothyroxine (SYNTHROID, LEVOTHROID) 112 MCG tablet Take 1 tablet (112 mcg total) by mouth daily.  30 tablet  6   The PMH, PSH, Social History, Family History, Medications, and allergies have been reviewed in [facility], and have been updated if relevant.     Review of Systems       See HPI No nausea  No vomiting Denies any anxiety or  depression   Physical Exam BP 122/78  Pulse 60  Temp 98 F (36.7 C)  Ht 5\' 8"  (1.727 m)  Wt 203 lb (92.08 kg)  BMI 30.87 kg/m2  LMP 01/15/2012 Wt Readings from Last 3 Encounters:  01/20/12 203 lb (92.08 kg)  09/17/11 229 lb 4 oz (103.987 kg)  01/12/11 260 lb (117.935 kg)    General:  Well-developed,well-nourished,in no acute distress; alert,appropriate and cooperative throughout examination overweight-appearing.   Head:  Normocephalic and atraumatic without obvious abnormalities. No apparent alopecia or balding. Eyes:  vision grossly intact, pupils equal, pupils round, pupils reactive to light, and no injection.   Ears:  External ear exam shows no significant lesions or deformities.  Otoscopic examination reveals clear canals, tympanic membranes are intact bilaterally without bulging, retraction, inflammation or discharge. Hearing is grossly normal bilaterally. Nose:  External nasal examination shows no deformity or inflammation. Nasal mucosa are pink and moist without lesions or exudates. Mouth:  Oral mucosa and oropharynx without lesions or exudates.  Teeth in good repair. Lungs:  Normal respiratory effort, chest expands symmetrically. Lungs are clear to auscultation, no crackles or wheezes. Heart:  normal rate and no murmur.   Abdomen:  Bowel sounds positive,abdomen soft and non-tender without masses, organomegaly or hernias  noted. Msk:  normal ROM, no joint tenderness, no joint swelling, no joint warmth, no redness over joints, no joint deformities, no joint instability, and no crepitation.   Pulses:  R popliteal normal, R posterior tibial normal, R dorsalis pedis normal, L posterior tibial normal, L dorsalis pedis normal, and L carotid normal.   Extremities:  No clubbing, cyanosis, edema, or deformity noted with normal full range of motion of all joints.   Neurologic:  No cranial nerve deficits noted. Station and gait are normal. Plantar reflexes are down-going bilaterally. DTRs  are symmetrical throughout. Sensory, motor and coordinative functions appear intact. Psych:  Cognition and judgment appear intact. Alert and cooperative with normal attention span and concentration. No apparent delusions, illusions, hallucinations  Assessment and Plan:  1. Morbid obesity  Much improved! Congratulated her on her success.   2. Hypothyroidism  Stable.  Continue synthroid at current dose.   3. HYPERLIPIDEMIA   Improved.  D/c simvastatin and fenofibrate due to recent weight loss.  Anticipates losing another 20 pounds!  Recheck lipid panel in 3 months.

## 2012-01-20 NOTE — Patient Instructions (Addendum)
Let's go ahead and stop your cholesterol medication- both the simvastatin and the fenofibrate. Let's recheck you cholesterol is 3 months- call to make lab visit. You look fantastic!  Keep up the great work.

## 2012-06-23 ENCOUNTER — Ambulatory Visit: Payer: Self-pay | Admitting: Family Medicine

## 2012-07-21 ENCOUNTER — Telehealth: Payer: Self-pay

## 2012-07-21 NOTE — Telephone Encounter (Signed)
Pt request copy of 12/2011 labs mailed to verified home address. Pt notified done.

## 2012-08-05 ENCOUNTER — Other Ambulatory Visit: Payer: Self-pay | Admitting: Family Medicine

## 2012-08-27 ENCOUNTER — Ambulatory Visit: Payer: Self-pay | Admitting: Physician Assistant

## 2012-08-27 LAB — CBC WITH DIFFERENTIAL/PLATELET
Bands: 5 %
Basophil: 1 %
Comment - H1-Com1: NORMAL
Comment - H1-Com2: NORMAL
HCT: 43.2 % (ref 35.0–47.0)
HGB: 14.8 g/dL (ref 12.0–16.0)
MCH: 29.8 pg (ref 26.0–34.0)
MCHC: 34.3 g/dL (ref 32.0–36.0)
MCV: 87 fL (ref 80–100)
RBC: 4.97 10*6/uL (ref 3.80–5.20)
RDW: 12.9 % (ref 11.5–14.5)
Variant Lymphocyte - H1-Rlymph: 37 %
WBC: 8.3 10*3/uL (ref 3.6–11.0)

## 2012-08-27 LAB — RAPID STREP-A WITH REFLX: Micro Text Report: NEGATIVE

## 2012-08-27 LAB — MONONUCLEOSIS SCREEN: Mono Test: POSITIVE

## 2012-08-29 ENCOUNTER — Ambulatory Visit: Payer: Self-pay | Admitting: Emergency Medicine

## 2012-08-29 LAB — BETA STREP CULTURE(ARMC)

## 2012-08-29 LAB — RAPID STREP-A WITH REFLX: Micro Text Report: NEGATIVE

## 2012-09-02 LAB — BETA STREP CULTURE(ARMC)

## 2012-09-07 ENCOUNTER — Telehealth: Payer: Self-pay

## 2012-09-07 DIAGNOSIS — E669 Obesity, unspecified: Secondary | ICD-10-CM

## 2012-09-07 NOTE — Telephone Encounter (Signed)
Referral placed.

## 2012-09-07 NOTE — Telephone Encounter (Signed)
Pt left vm requesting referral to nutritionist. Pt saw nutritionist last year but needs new referral for 2014. Pt wants to see Beatris Ship at [facility] at [facility]. Pt request call back when referral done.

## 2012-09-08 NOTE — Telephone Encounter (Signed)
Advised patient she will be hearing back form [place].

## 2012-09-27 ENCOUNTER — Ambulatory Visit: Payer: Self-pay | Admitting: Family Medicine

## 2012-10-01 ENCOUNTER — Ambulatory Visit: Payer: Self-pay | Admitting: Family Medicine

## 2012-10-29 ENCOUNTER — Ambulatory Visit: Payer: Self-pay | Admitting: Family Medicine

## 2013-02-15 ENCOUNTER — Other Ambulatory Visit: Payer: Self-pay | Admitting: Family Medicine

## 2013-02-15 DIAGNOSIS — E785 Hyperlipidemia, unspecified: Secondary | ICD-10-CM

## 2013-02-15 DIAGNOSIS — E039 Hypothyroidism, unspecified: Secondary | ICD-10-CM

## 2013-02-27 ENCOUNTER — Other Ambulatory Visit (INDEPENDENT_AMBULATORY_CARE_PROVIDER_SITE_OTHER): Payer: Commercial Managed Care - PPO

## 2013-02-27 DIAGNOSIS — E785 Hyperlipidemia, unspecified: Secondary | ICD-10-CM

## 2013-02-27 DIAGNOSIS — E039 Hypothyroidism, unspecified: Secondary | ICD-10-CM

## 2013-02-27 LAB — LIPID PANEL
Cholesterol: 158 mg/dL (ref 0–200)
VLDL: 17.6 mg/dL (ref 0.0–40.0)

## 2013-02-27 LAB — TSH: TSH: 2.05 u[IU]/mL (ref 0.35–5.50)

## 2013-02-27 LAB — COMPREHENSIVE METABOLIC PANEL
ALT: 13 U/L (ref 0–35)
CO2: 29 mEq/L (ref 19–32)
Calcium: 9 mg/dL (ref 8.4–10.5)
Chloride: 108 mEq/L (ref 96–112)
GFR: 85.82 mL/min (ref >60.00)
Glucose, Bld: 80 mg/dL (ref 70–99)
Sodium: 140 mEq/L (ref 135–145)
Total Bilirubin: 0.7 mg/dL (ref 0.3–1.2)
Total Protein: 6.9 g/dL (ref 6.0–8.3)

## 2013-02-27 LAB — T4, FREE: Free T4: 1.06 ng/dL (ref 0.60–1.60)

## 2013-03-06 ENCOUNTER — Ambulatory Visit (INDEPENDENT_AMBULATORY_CARE_PROVIDER_SITE_OTHER): Payer: Commercial Managed Care - PPO | Admitting: Family Medicine

## 2013-03-06 ENCOUNTER — Encounter: Payer: Self-pay | Admitting: Family Medicine

## 2013-03-06 VITALS — BP 120/78 | HR 60 | Temp 98.6°F | Ht 68.5 in | Wt 186.0 lb

## 2013-03-06 DIAGNOSIS — Z0000 Encounter for general adult medical examination without abnormal findings: Secondary | ICD-10-CM

## 2013-03-06 DIAGNOSIS — E785 Hyperlipidemia, unspecified: Secondary | ICD-10-CM

## 2013-03-06 DIAGNOSIS — E039 Hypothyroidism, unspecified: Secondary | ICD-10-CM

## 2013-03-06 MED ORDER — LEVOTHYROXINE SODIUM 112 MCG PO TABS: ORAL_TABLET | ORAL | Status: DC

## 2013-03-06 NOTE — Progress Notes (Signed)
27 yo pleasant female here for CPX.  Pt has a GYN- Dr. Vickey Sages- 3 weeks ago, pap neg.    HLD- Continues to lose weight  with diet and exercise!  Feels great.  She is very proud of how far she has come. Was on  Simvastatin 40 mg daily and Fenofibrate 160 mg daily but now diet controlled.  Wt Readings from Last 3 Encounters:  03/06/13 186 lb (84.369 kg)  01/20/12 203 lb (92.08 kg)  09/17/11 229 lb 4 oz (103.987 kg)   Continues to run with running club.  Has run two half marathons since I last saw her.  Lab Results  Component Value Date   CHOL 158 02/27/2013   HDL 51.50 02/27/2013   LDLCALC 89 02/27/2013   LDLDIRECT 162.9 07/06/2007   TRIG 88.0 02/27/2013   CHOLHDL 3 02/27/2013    Hypothyroidism- On Synthroid 112 mcg daily.  Denies any symptoms of hypo or hyperthyroidism.   Lab Results  Component Value Date   TSH 2.05 02/27/2013   Patient Active Problem List   Diagnosis Date Noted  . Shin splints 09/17/2011  . Routine general medical examination at a health care facility 01/12/2011  . HYPERLIPIDEMIA 01/06/2010  . NEVI, MULTIPLE 12/16/2009  . MORBID OBESITY 12/16/2009  . HYPOTHYROIDISM 06/17/2007   Past Medical History  Diagnosis Date  . Hypothyroidism   . Morbid obesity    Past Surgical History  Procedure Laterality Date  . Appendectomy     History  Substance Use Topics  . Smoking status: Never Smoker   . Smokeless tobacco: Not on file  . Alcohol Use: Not on file   Family History  Problem Relation Age of Onset  . Hypertension Mother   . Hypertension Father    Allergies  Allergen Reactions  . Lidocaine    Current Outpatient Prescriptions on File Prior to Visit  Medication Sig Dispense Refill  . levothyroxine (SYNTHROID, LEVOTHROID) 112 MCG tablet TAKE 1 TABLET BY MOUTH EVERY DAY  30 tablet  5   No current facility-administered medications on file prior to visit.   The PMH, PSH, Social History, Family History, Medications, and allergies have been reviewed in [facility]: [facility],  and have been updated if relevant.     Review of Systems       See HPI No nausea  No vomiting Denies any anxiety or depression   Physical Exam There were no vitals taken for this visit. Wt Readings from Last 3 Encounters:  01/20/12 203 lb (92.08 kg)  09/17/11 229 lb 4 oz (103.987 kg)  01/12/11 260 lb (117.935 kg)    General:  Well-developed,well-nourished,in no acute distress; alert,appropriate and cooperative throughout examination overweight-appearing.   Head:  Normocephalic and atraumatic without obvious abnormalities. No apparent alopecia or balding. Eyes:  vision grossly intact, pupils equal, pupils round, pupils reactive to light, and no injection.   Ears:  External ear exam shows no significant lesions or deformities.  Otoscopic examination reveals clear canals, tympanic membranes are intact bilaterally without bulging, retraction, inflammation or discharge. Hearing is grossly normal bilaterally. Nose:  External nasal examination shows no deformity or inflammation. Nasal mucosa are pink and moist without lesions or exudates. Mouth:  Oral mucosa and oropharynx without lesions or exudates.  Teeth in good repair. Lungs:  Normal respiratory effort, chest expands symmetrically. Lungs are clear to auscultation, no crackles or wheezes. Heart:  normal rate and no murmur.   Abdomen:  Bowel sounds positive,abdomen soft and non-tender without masses, organomegaly or hernias noted. Msk:  normal ROM, no joint tenderness, no joint swelling, no joint warmth, no redness over joints, no joint deformities, no joint instability, and no crepitation.   Pulses:  R popliteal normal, R posterior tibial normal, R dorsalis pedis normal, L posterior tibial normal, L dorsalis pedis normal, and L carotid normal.   Extremities:  No clubbing, cyanosis, edema, or deformity noted with normal full range of motion of all joints.   Neurologic:  No cranial nerve deficits noted. Station and gait are normal. Plantar  reflexes are down-going bilaterally. DTRs are symmetrical throughout. Sensory, motor and coordinative functions appear intact. Psych:  Cognition and judgment appear intact. Alert and cooperative with normal attention span and concentration. No apparent delusions, illusions, hallucinations  Assessment and Plan:  1. Routine general medical examination at a health care facility Reviewed preventive care protocols, scheduled due services, and updated immunizations Discussed nutrition, exercise, diet, and healthy lifestyle.  2. Hypothyroidism Stable.  Rx refilled.  3. HYPERLIPIDEMIA Diet controlled!

## 2013-04-19 ENCOUNTER — Encounter: Payer: Self-pay | Admitting: Family Medicine

## 2013-04-19 ENCOUNTER — Ambulatory Visit (INDEPENDENT_AMBULATORY_CARE_PROVIDER_SITE_OTHER): Payer: Commercial Managed Care - PPO | Admitting: Family Medicine

## 2013-04-19 VITALS — BP 120/80 | HR 68 | Temp 97.9°F | Ht 68.5 in | Wt 188.0 lb

## 2013-04-19 DIAGNOSIS — N920 Excessive and frequent menstruation with regular cycle: Secondary | ICD-10-CM | POA: Insufficient documentation

## 2013-04-19 NOTE — Progress Notes (Signed)
27 yo pleasant female here to discuss heavy menses x 6 months.  Pt has a GYN- Dr. Vickey Sages- whom she saw a couple of months ago.   Past six months, periods are becoming progressively heavier and cramping is worsening.  Previously, period would last 2-3 days, only one day with heavy menses.  This past period, lasted 7 days, 4 days were very heavy.  Bled through her tampons.  Had been on OCPs for years but stopped taking them once she started losing weight- afraid that it was hindering her weight loss.  No weakness or dizziness.  Hypothyroidism- On Synthroid 112 mcg daily.  Denies any symptoms of hypo or hyperthyroidism.   Lab Results  Component Value Date   TSH 2.05 02/27/2013   Patient Active Problem List   Diagnosis Date Noted  . Heavy menses 04/19/2013  . Shin splints 09/17/2011  . Routine general medical examination at a health care facility 01/12/2011  . HYPERLIPIDEMIA 01/06/2010  . NEVI, MULTIPLE 12/16/2009  . MORBID OBESITY 12/16/2009  . HYPOTHYROIDISM 06/17/2007   Past Medical History  Diagnosis Date  . Hypothyroidism   . Morbid obesity    Past Surgical History  Procedure Laterality Date  . Appendectomy     History  Substance Use Topics  . Smoking status: Never Smoker   . Smokeless tobacco: Not on file  . Alcohol Use: Not on file   Family History  Problem Relation Age of Onset  . Hypertension Mother   . Hypertension Father    Allergies  Allergen Reactions  . Lidocaine    Current Outpatient Prescriptions on File Prior to Visit  Medication Sig Dispense Refill  . levothyroxine (SYNTHROID, LEVOTHROID) 112 MCG tablet TAKE 1 TABLET BY MOUTH EVERY DAY  30 tablet  11   No current facility-administered medications on file prior to visit.   The PMH, PSH, Social History, Family History, Medications, and allergies have been reviewed in [facility], and have been updated if relevant.     Review of Systems       See HPI No SOB   Physical Exam BP 120/80  Pulse 68   Temp(Src) 97.9 F (36.6 C)  Ht 5' 8.5" (1.74 m)  Wt 188 lb (85.276 kg)  BMI 28.17 kg/m2  Wt Readings from Last 3 Encounters:  04/19/13 188 lb (85.276 kg)  03/06/13 186 lb (84.369 kg)  01/20/12 203 lb (92.08 kg)    General:  Well-developed,well-nourished,in no acute distress; alert,appropriate and cooperative throughout examination overweight-appearing.   Head:  Normocephalic and atraumatic without obvious abnormalities. No apparent alopecia or balding. Eyes:  vision grossly intact, pupils equal, pupils round, pupils reactive to light, and no injection.   Ears:  External ear exam shows no significant lesions or deformities.  Otoscopic examination reveals clear canals, tympanic membranes are intact bilaterally without bulging, retraction, inflammation or discharge. Hearing is grossly normal bilaterally. Nose:  External nasal examination shows no deformity or inflammation. Nasal mucosa are pink and moist without lesions or exudates. Mouth:  Oral mucosa and oropharynx without lesions or exudates.  Teeth in good repair. Psych:  Cognition and judgment appear intact. Alert and cooperative with normal attention span and concentration. No apparent delusions, illusions, hallucinations  Assessment and Plan:  1. Heavy menses Discussed with pt. Dr. Vickey Sages has records of which OCPs she has tried so it is best for her to discuss this with her and pt agrees.  I did recommend that she restart hormonal contraceptives.  Also, will proceed with ordering pelvic ultrasound  to rule out fibroid tumors, ovarian cysts, endometriosis, etc. The patient indicates understanding of these issues and agrees with the plan.

## 2013-04-19 NOTE — Patient Instructions (Addendum)
Good to see you. Please stop by to see Shirlee Limerick on your way out to set up your ultrasound.  We will call you with your results.  You can either let me or Dr. Vickey Sages know if you want to restart the birth control pill.

## 2013-04-25 ENCOUNTER — Ambulatory Visit: Payer: Self-pay | Admitting: Family Medicine

## 2013-04-26 ENCOUNTER — Encounter: Payer: Self-pay | Admitting: Family Medicine

## 2013-05-03 ENCOUNTER — Encounter: Payer: Self-pay | Admitting: Family Medicine

## 2013-05-03 NOTE — Telephone Encounter (Signed)
Report is on your desk, please advise.

## 2013-05-09 ENCOUNTER — Telehealth: Payer: Self-pay | Admitting: *Deleted

## 2013-05-09 NOTE — Telephone Encounter (Signed)
Message copied by Eliezer Bottom on Tue May 09, 2013  3:38 PM ------      Message from: Dianne Dun      Created: Tue May 09, 2013  3:15 PM       Ok thanks.  I think there is something going on with mychart.  She cant see her results.      ----- Message -----         From: Eliezer Bottom, CMA         Sent: 05/09/2013   3:10 PM           To: Dianne Dun, MD            I talked to her on 8/28 and gave her the results and she said she was feeling ok at the time and would have to wait until her next menses to see how she would feel then.  I still had a message from her in my inbasket asking if there were more details about her results.  This confused me, so I sent her a note today asking her if she still had questions, so I guess she sent you a note back.  I will call her again, rather than send another email.      ----- Message -----         From: Dianne Dun, MD         Sent: 05/09/2013   2:37 PM           To: Eliezer Bottom, CMA            I have received multiple messages from Holly Singleton that I have forwarded to you and she just sent another my chart message saying that she couldn't understand her results.  Have you called her yet?             ------

## 2013-05-09 NOTE — Telephone Encounter (Signed)
Spoke with patient.  She said she remembered talking with me, but said she thought her report would show up on my chart, but she never saw anything.  She states she's feeling fine now.

## 2013-07-06 ENCOUNTER — Other Ambulatory Visit: Payer: Self-pay

## 2013-08-22 ENCOUNTER — Telehealth: Payer: Self-pay | Admitting: Family Medicine

## 2013-08-22 NOTE — Telephone Encounter (Signed)
Will update tetanus and evaluate the wound Past the time for primary closure regardless

## 2013-08-22 NOTE — Telephone Encounter (Signed)
Patient Information:  Caller Name: [place]  Phone: [phone]  Patient: Holly Singleton  Gender: Female  DOB: December 20, 1985  Age: 27 Years  PCP: Ruthe Mannan [facility])  Pregnant: No  Office Follow Up:  Does the office need to follow up with this patient?: No  Instructions For The Office: N/A   Symptoms  Reason For Call & Symptoms: 08/20/13 she cut left index finger with a knife.  It is on a knuckle and stopped  bleeding and used Neosporin and wrapped it.  This AM she glued it  and she has  rewrapped it.  it is about an inch long..  Last Tetanus was in 2004.  Reviewed Health History In EMR: Yes  Reviewed Medications In EMR: Yes  Reviewed Allergies In EMR: Yes  Reviewed Surgeries / Procedures: Yes  Date of Onset of Symptoms: 08/21/2013  Treatments Tried: Washed, Neosporin, skin glue and wrapped  Treatments Tried Worked: No OB / GYN:  LMP: 08/02/2013  Guideline(s) Used:  Finger Injury  Disposition Per Guideline:   See Today in Office  Reason For Disposition Reached:   Finger joint can't be opened (straightened) or closed (bent) completely  Advice Given:  N/A  Patient Will Follow Care Advice:  YES  Appointment Scheduled:  08/23/2013 11:15:00 Appointment Scheduled Provider:  Tillman Abide (Family Practice)

## 2013-08-23 ENCOUNTER — Ambulatory Visit (INDEPENDENT_AMBULATORY_CARE_PROVIDER_SITE_OTHER): Payer: Commercial Managed Care - PPO | Admitting: Internal Medicine

## 2013-08-23 ENCOUNTER — Encounter: Payer: Self-pay | Admitting: Internal Medicine

## 2013-08-23 VITALS — BP 126/78 | HR 77 | Temp 98.9°F | Wt 192.5 lb

## 2013-08-23 DIAGNOSIS — Z23 Encounter for immunization: Secondary | ICD-10-CM

## 2013-08-23 DIAGNOSIS — S61209A Unspecified open wound of unspecified finger without damage to nail, initial encounter: Secondary | ICD-10-CM

## 2013-08-23 DIAGNOSIS — S61219A Laceration without foreign body of unspecified finger without damage to nail, initial encounter: Secondary | ICD-10-CM | POA: Insufficient documentation

## 2013-08-23 MED ORDER — AMOXICILLIN-POT CLAVULANATE 875-125 MG PO TABS: 1.0000 | ORAL_TABLET | Freq: Two times a day (BID) | ORAL | Status: DC

## 2013-08-23 NOTE — Progress Notes (Signed)
Pre-visit discussion using our clinic review tool. No additional management support is needed unless otherwise documented below in the visit note.  

## 2013-08-23 NOTE — Patient Instructions (Signed)
You may wash your hand with soap and water, then pat dry. Call for a reevaluation if it gets worse. Try to gently move and bend your finger to help the swelling

## 2013-08-23 NOTE — Progress Notes (Signed)
   Subjective:    Patient ID: Holly Singleton, female    DOB: July 24, 1986, 27 y.o.   MRN: [id number]  HPI Cut left 2nd finger on bread knife 2 nights ago Rinsed it thoroughly and then put pressure dressing on it Decided against the ER---too late for urgent care  Did get hemostasis Some swelling Dressed with neosporin and splint Pain did improve after an hour  Felt better yesterday AM Still tender though Cleaned again and went to work Then recleaned it last night---then used liquid skin on it  Current Outpatient Prescriptions on File Prior to Visit  Medication Sig Dispense Refill  . levothyroxine (SYNTHROID, LEVOTHROID) 112 MCG tablet TAKE 1 TABLET BY MOUTH EVERY DAY  30 tablet  11   No current facility-administered medications on file prior to visit.    Allergies  Allergen Reactions  . Lidocaine     Past Medical History  Diagnosis Date  . Hypothyroidism   . Morbid obesity     Past Surgical History  Procedure Laterality Date  . Appendectomy      Family History  Problem Relation Age of Onset  . Hypertension Mother   . Hypertension Father     History   Social History  . Marital Status: Married    Spouse Name: N/A    Number of Children: N/A  . Years of Education: N/A   Occupational History  . Set designer [employer]   Social History Main Topics  . Smoking status: Never Smoker   . Smokeless tobacco: Not on file  . Alcohol Use: Not on file  . Drug Use: Not on file  . Sexual Activity: Not on file   Other Topics Concern  . Not on file   Social History Narrative   Recently divorced.   Review of Systems Feels fine No fever    Objective:   Physical Exam  Constitutional: She appears well-developed and well-nourished. No distress.  Skin:  ~78mm laceration over the extensor PIP of left 2nd finger Mild swelling and tenderness Not really red Limited mobility          Assessment & Plan:

## 2013-08-23 NOTE — Addendum Note (Signed)
Addended by: Sueanne Margarita on: 08/23/2013 12:11 PM   Modules accepted: Orders

## 2013-08-23 NOTE — Assessment & Plan Note (Signed)
No clear infection--but tender and high risk Will treat with augmentin  Avoid the liquid skin Steri strips applied May clean with soap and water

## 2014-02-15 ENCOUNTER — Other Ambulatory Visit: Payer: Self-pay | Admitting: Family Medicine

## 2014-02-15 DIAGNOSIS — Z0000 Encounter for general adult medical examination without abnormal findings: Secondary | ICD-10-CM

## 2014-02-15 DIAGNOSIS — E039 Hypothyroidism, unspecified: Secondary | ICD-10-CM

## 2014-02-15 DIAGNOSIS — E785 Hyperlipidemia, unspecified: Secondary | ICD-10-CM

## 2014-03-01 ENCOUNTER — Other Ambulatory Visit: Payer: Commercial Managed Care - PPO

## 2014-03-07 ENCOUNTER — Encounter: Payer: Commercial Managed Care - PPO | Admitting: Family Medicine

## 2014-04-12 ENCOUNTER — Other Ambulatory Visit: Payer: Self-pay | Admitting: Family Medicine

## 2014-05-24 ENCOUNTER — Other Ambulatory Visit (INDEPENDENT_AMBULATORY_CARE_PROVIDER_SITE_OTHER): Payer: Commercial Managed Care - PPO

## 2014-05-24 DIAGNOSIS — Z0000 Encounter for general adult medical examination without abnormal findings: Secondary | ICD-10-CM

## 2014-05-24 DIAGNOSIS — E039 Hypothyroidism, unspecified: Secondary | ICD-10-CM

## 2014-05-24 DIAGNOSIS — E785 Hyperlipidemia, unspecified: Secondary | ICD-10-CM

## 2014-05-24 LAB — CBC WITH DIFFERENTIAL/PLATELET
BASOS PCT: 0.9 % (ref 0.0–3.0)
Basophils Absolute: 0.1 10*3/uL (ref 0.0–0.1)
EOS PCT: 2.7 % (ref 0.0–5.0)
Eosinophils Absolute: 0.2 10*3/uL (ref 0.0–0.7)
HCT: 40.8 % (ref 36.0–46.0)
Hemoglobin: 13.9 g/dL (ref 12.0–15.0)
Lymphocytes Relative: 29.8 % (ref 12.0–46.0)
Lymphs Abs: 2.1 10*3/uL (ref 0.7–4.0)
MCHC: 33.9 g/dL (ref 30.0–36.0)
MCV: 89.8 fl (ref 78.0–100.0)
MONOS PCT: 6.7 % (ref 3.0–12.0)
Monocytes Absolute: 0.5 10*3/uL (ref 0.1–1.0)
NEUTROS PCT: 59.9 % (ref 43.0–77.0)
Neutro Abs: 4.2 10*3/uL (ref 1.4–7.7)
PLATELETS: 322 10*3/uL (ref 150.0–400.0)
RBC: 4.55 Mil/uL (ref 3.87–5.11)
RDW: 12.2 % (ref 11.5–15.5)
WBC: 7 10*3/uL (ref 4.0–10.5)

## 2014-05-24 LAB — TSH: TSH: 2.06 u[IU]/mL (ref 0.35–4.50)

## 2014-05-24 LAB — LIPID PANEL
CHOL/HDL RATIO: 3
CHOLESTEROL: 152 mg/dL (ref 0–200)
HDL: 51.4 mg/dL (ref >39.00)
LDL CALC: 88 mg/dL (ref 0–99)
NonHDL: 100.6
TRIGLYCERIDES: 62 mg/dL (ref 0.0–149.0)
VLDL: 12.4 mg/dL (ref 0.0–40.0)

## 2014-05-24 LAB — T4, FREE: Free T4: 1.29 ng/dL (ref 0.60–1.60)

## 2014-05-31 ENCOUNTER — Encounter: Payer: Self-pay | Admitting: Family Medicine

## 2014-05-31 ENCOUNTER — Ambulatory Visit (INDEPENDENT_AMBULATORY_CARE_PROVIDER_SITE_OTHER): Payer: Commercial Managed Care - PPO | Admitting: Family Medicine

## 2014-05-31 VITALS — BP 114/74 | HR 75 | Temp 97.9°F | Ht 68.0 in | Wt 182.2 lb

## 2014-05-31 DIAGNOSIS — E039 Hypothyroidism, unspecified: Secondary | ICD-10-CM

## 2014-05-31 DIAGNOSIS — E785 Hyperlipidemia, unspecified: Secondary | ICD-10-CM

## 2014-05-31 DIAGNOSIS — Z0000 Encounter for general adult medical examination without abnormal findings: Secondary | ICD-10-CM

## 2014-05-31 MED ORDER — LEVOTHYROXINE SODIUM 112 MCG PO TABS: ORAL_TABLET | ORAL | Status: DC

## 2014-05-31 NOTE — Progress Notes (Signed)
28 yo pleasant female here for CPX.  Overall doing well.  Mom was diagnosed with lung CA in 11/2013 but she is actually doing very well.  Just finished chemo an dis cancer free.  Pt has a GYN- Dr. Orvan Seen- pap UTD. Sexually active with boyfriend only.  Periods still heavy but did not restart OCPs- feels cramping is controlled now that she is exercising so intensely.  HLD- Continues to lose weight  with diet and exercise!  Feels great.  She is very proud of how far she has come. Was on  Simvastatin 40 mg daily and Fenofibrate 160 mg daily but now diet controlled. Running a marathon in [place], [place] next month.  Wt Readings from Last 3 Encounters:  05/31/14 182 lb 4 oz (82.668 kg)  08/23/13 192 lb 8 oz (87.317 kg)  04/19/13 188 lb (85.276 kg)   Lab Results  Component Value Date   CHOL 152 05/24/2014   HDL 51.40 05/24/2014   LDLCALC 88 05/24/2014   LDLDIRECT 162.9 07/06/2007   TRIG 62.0 05/24/2014   CHOLHDL 3 05/24/2014    Hypothyroidism- On Synthroid 112 mcg daily.  Denies any symptoms of hypo or hyperthyroidism.   Lab Results  Component Value Date   TSH 2.06 05/24/2014   Patient Active Problem List   Diagnosis Date Noted  . Heavy menses 04/19/2013  . Routine general medical examination at a health care facility 01/12/2011  . HLD (hyperlipidemia) 01/06/2010  . NEVI, MULTIPLE 12/16/2009  . MORBID OBESITY 12/16/2009  . Hypothyroidism 06/17/2007   Past Medical History  Diagnosis Date  . Hypothyroidism   . Morbid obesity    Past Surgical History  Procedure Laterality Date  . Appendectomy     History  Substance Use Topics  . Smoking status: Never Smoker   . Smokeless tobacco: Not on file  . Alcohol Use: Not on file   Family History  Problem Relation Age of Onset  . Hypertension Mother   . Hypertension Father    Allergies  Allergen Reactions  . Lidocaine    Current Outpatient Prescriptions on File Prior to Visit  Medication Sig Dispense Refill  . levothyroxine  (SYNTHROID, LEVOTHROID) 112 MCG tablet TAKE 1 TABLET BY MOUTH EVERY DAY  30 tablet  0   No current facility-administered medications on file prior to visit.   The PMH, PSH, Social History, Family History, Medications, and allergies have been reviewed in [facility], and have been updated if relevant.     Review of Systems       See HPI  Patient reports no  vision/ hearing changes,anorexia, weight change, fever ,adenopathy, persistant / recurrent hoarseness, swallowing issues, chest pain, edema,persistant / recurrent cough, hemoptysis, dyspnea(rest, exertional, paroxysmal nocturnal), gastrointestinal  bleeding (melena, rectal bleeding), abdominal pain, excessive heart burn, GU symptoms(dysuria, hematuria, pyuria, voiding/incontinence  Issues) syncope, focal weakness, severe memory loss, concerning skin lesions, depression, anxiety, abnormal bruising/bleeding, major joint swelling, breast masses or abnormal vaginal bleeding.       Physical Exam Ht 5\' 8"  (1.727 m)  Wt 182 lb 4 oz (82.668 kg)  BMI 27.72 kg/m2  LMP 05/19/2014 Wt Readings from Last 3 Encounters:  05/31/14 182 lb 4 oz (82.668 kg)  08/23/13 192 lb 8 oz (87.317 kg)  04/19/13 188 lb (85.276 kg)    General:  Well-developed,well-nourished,in no acute distress; alert,appropriate and cooperative throughout examination  Head:  Normocephalic and atraumatic without obvious abnormalities. No apparent alopecia or balding. Eyes:  vision grossly intact, pupils equal, pupils round, pupils reactive  to light, and no injection.   Ears:  External ear exam shows no significant lesions or deformities.  Otoscopic examination reveals clear canals, tympanic membranes are intact bilaterally without bulging, retraction, inflammation or discharge. Hearing is grossly normal bilaterally. Nose:  External nasal examination shows no deformity or inflammation. Nasal mucosa are pink and moist without lesions or exudates. Mouth:  Oral mucosa and oropharynx without  lesions or exudates.  Teeth in good repair. Lungs:  Normal respiratory effort, chest expands symmetrically. Lungs are clear to auscultation, no crackles or wheezes. Heart:  normal rate and no murmur.   Abdomen:  Bowel sounds positive,abdomen soft and non-tender without masses, organomegaly or hernias noted. Msk:  normal ROM, no joint tenderness, no joint swelling, no joint warmth, no redness over joints, no joint deformities, no joint instability, and no crepitation.   Pulses:  R popliteal normal, R posterior tibial normal, R dorsalis pedis normal, L posterior tibial normal, L dorsalis pedis normal, and L carotid normal.   Extremities:  No clubbing, cyanosis, edema, or deformity noted with normal full range of motion of all joints.   Neurologic:  No cranial nerve deficits noted. Station and gait are normal. Plantar reflexes are down-going bilaterally. DTRs are symmetrical throughout. Sensory, motor and coordinative functions appear intact. Psych:  Cognition and judgment appear intact. Alert and cooperative with normal attention span and concentration. No apparent delusions, illusions, hallucinations

## 2014-05-31 NOTE — Assessment & Plan Note (Signed)
Diet controlled.  

## 2014-05-31 NOTE — Progress Notes (Signed)
Pre visit review using our clinic review tool, if applicable. No additional management support is needed unless otherwise documented below in the visit note. 

## 2014-05-31 NOTE — Patient Instructions (Signed)
It was so great to see you. Good luck in [place]. Keep me updated!

## 2014-05-31 NOTE — Assessment & Plan Note (Signed)
Much improved. BMI now 27.72 and continues to lose weight with diet and exercise.

## 2014-05-31 NOTE — Assessment & Plan Note (Signed)
Euthyroid on current dose of synthroid. No changes made. 

## 2014-05-31 NOTE — Assessment & Plan Note (Signed)
Reviewed preventive care protocols, scheduled due services, and updated immunizations Discussed nutrition, exercise, diet, and healthy lifestyle.  

## 2015-01-25 ENCOUNTER — Encounter: Payer: Self-pay | Admitting: Family Medicine

## 2015-02-14 ENCOUNTER — Ambulatory Visit (INDEPENDENT_AMBULATORY_CARE_PROVIDER_SITE_OTHER): Payer: Commercial Managed Care - PPO | Admitting: Family Medicine

## 2015-02-14 VITALS — BP 114/64 | HR 73 | Temp 98.0°F | Wt 187.2 lb

## 2015-02-14 DIAGNOSIS — Z3201 Encounter for pregnancy test, result positive: Secondary | ICD-10-CM

## 2015-02-14 DIAGNOSIS — N912 Amenorrhea, unspecified: Secondary | ICD-10-CM | POA: Diagnosis not present

## 2015-02-14 LAB — POCT URINE PREGNANCY: Preg Test, Ur: POSITIVE — AB

## 2015-02-14 NOTE — Progress Notes (Signed)
left

## 2015-02-14 NOTE — Progress Notes (Signed)
   Subjective:   Patient ID: Holly Singleton, female    DOB: Jun 17, 1986, 29 y.o.   MRN: [id number]  Holly Singleton is a pleasant 29 y.o. year old female who presents to clinic today with Amenorrhea  on 02/14/2015  HPI:  3 positive home pregnancy tests.  She is in a good place with her boyfriend, but she does not feel this is the right time to have a child.    She is here to confirm her pregnancy and to discuss options.  LMP 01/07/15.  She is not taking a PNV.  Only taking synthroid.  Lab Results  Component Value Date   TSH 2.06 05/24/2014     Current Outpatient Prescriptions on File Prior to Visit  Medication Sig Dispense Refill  . levothyroxine (SYNTHROID, LEVOTHROID) 112 MCG tablet TAKE 1 TABLET BY MOUTH EVERY DAY 90 tablet 2   No current facility-administered medications on file prior to visit.    Allergies  Allergen Reactions  . Lidocaine     Past Medical History  Diagnosis Date  . Hypothyroidism   . Morbid obesity     Past Surgical History  Procedure Laterality Date  . Appendectomy      Family History  Problem Relation Age of Onset  . Hypertension Mother   . Cancer Mother 66    lung CA  . Hypertension Father     History   Social History  . Marital Status: Divorced    Spouse Name: N/A  . Number of Children: N/A  . Years of Education: N/A   Occupational History  . 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 Smoking status: Never Smoker   . Smokeless tobacco: Not on file  . Alcohol Use: Not on file  . Drug Use: Not on file  . Sexual Activity: Not on file   Other Topics Concern  . Not on file   Social History Narrative   Recently divorced.   The PMH, PSH, Social History, Family History, Medications, and allergies have been reviewed in [facility], and have been updated if relevant.   Review of Systems  Constitutional: Positive for fatigue.  Gastrointestinal: Negative.   Psychiatric/Behavioral: The patient is nervous/anxious.   All  other systems reviewed and are negative.      Objective:    BP 114/64 mmHg  Pulse 73  Temp(Src) 98 F (36.7 C) (Oral)  Wt 187 lb 4 oz (84.936 kg)  SpO2 98%  LMP 01/04/2015   Physical Exam  Constitutional: She is oriented to person, place, and time. She appears well-developed and well-nourished. No distress.  HENT:  Head: Normocephalic.  Eyes: Conjunctivae are normal.  Neck: Normal range of motion.  Cardiovascular: Normal rate.   Pulmonary/Chest: Effort normal.  Musculoskeletal: Normal range of motion.  Neurological: She is alert and oriented to person, place, and time. No cranial nerve deficit.  Skin: Skin is warm and dry.  Psychiatric:  tearful  Nursing note and vitals reviewed.         Assessment & Plan:   Positive pregnancy test No Follow-up on file.

## 2015-02-14 NOTE — Assessment & Plan Note (Signed)
>  25 minutes spent in face to face time with patient, >50% spent in counselling or coordination of care She is unsure of her decision about whether or not to maintain this pregnancy.  [facility] and advised her to call her OB/GYN, Dr. Julien Girt who she just saw a few weeks ago. Also advised PNV until she decides. The patient indicates understanding of these issues and agrees with the plan.

## 2015-02-27 IMAGING — US US PELV - US TRANSVAGINAL
1 series · 14 of 25 positions shown · non-contrast
Comparison: none

REASON FOR EXAM: Heavy menses
COMMENTS:

PROCEDURE:     US  - US PELVIS EXAM W/TRANSVAGINAL  - April 25, 2013  [DATE]
RESULT:
TECHNIQUE: Transabdominal sonographic imaging was performed. Endovaginal
imaging was performed for characterization of the pelvic anatomy.

[Series 1: us pelv - us transvaginal · 0.28mm/px · 14 of 81 slices shown]
[im 1/81]
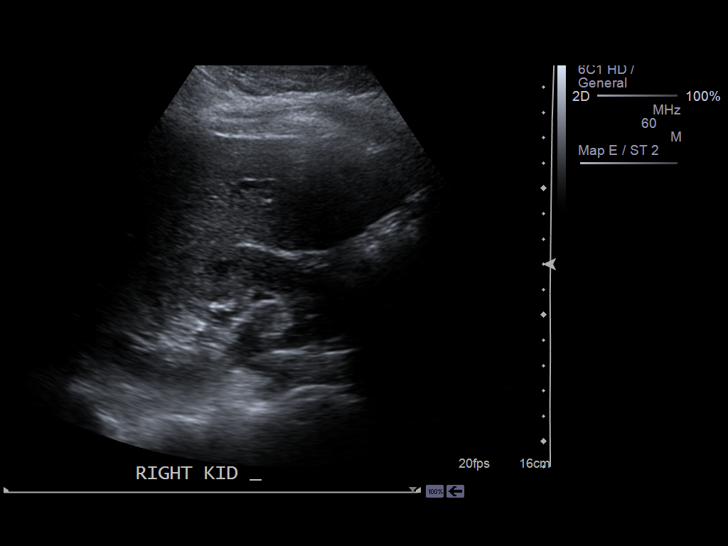
[im 7/81]
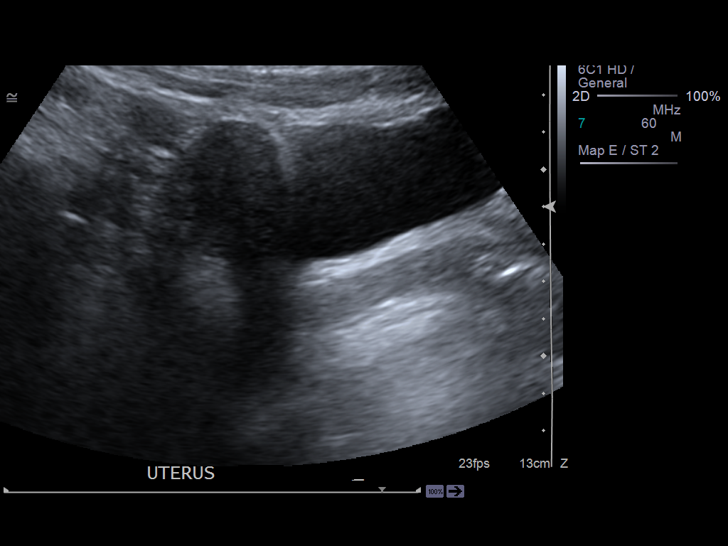
[im 14/81]
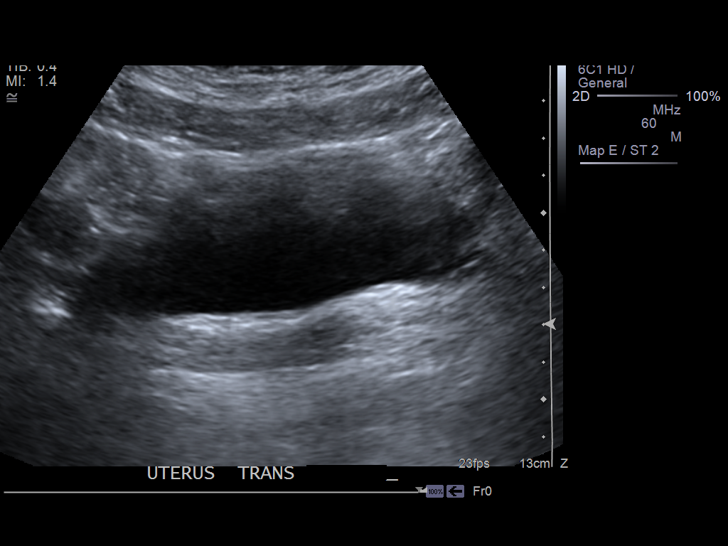
[im 21/81]
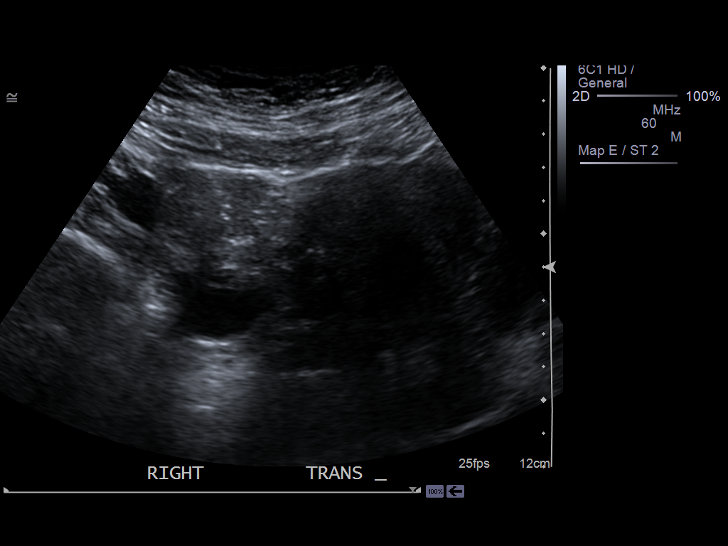
[im 27/81]
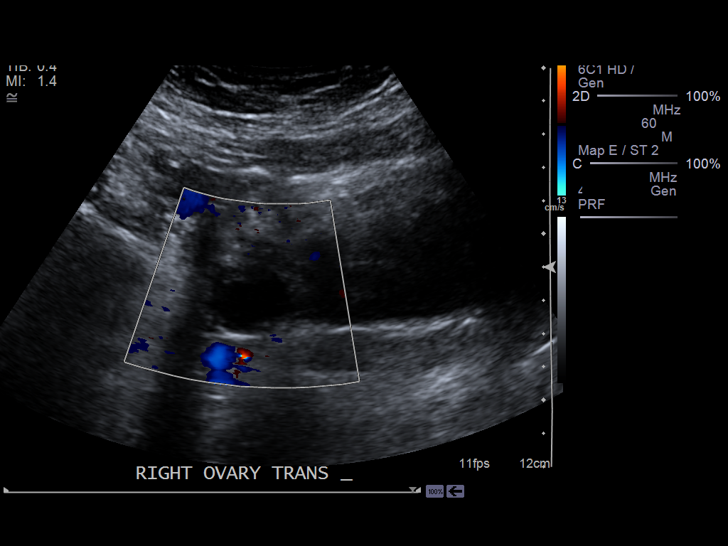
[im 31/81]
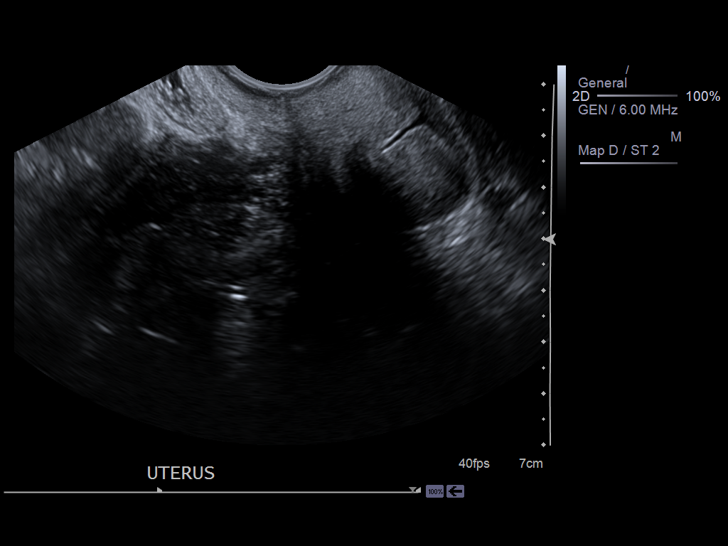
[im 37/81]
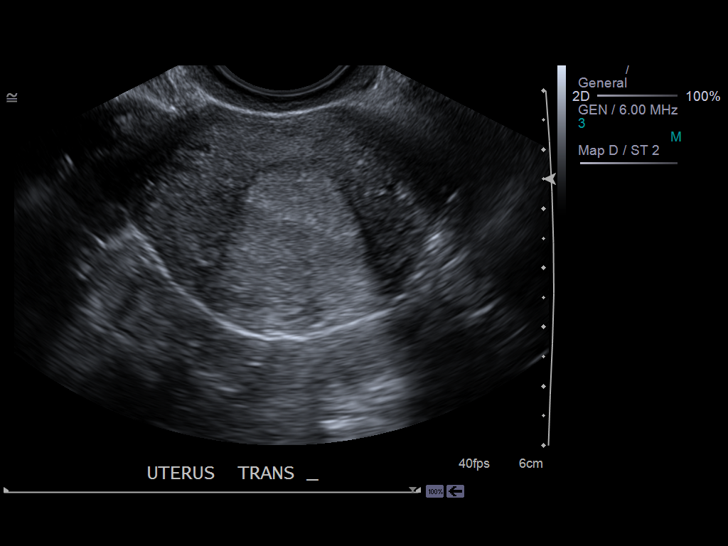
[im 44/81]
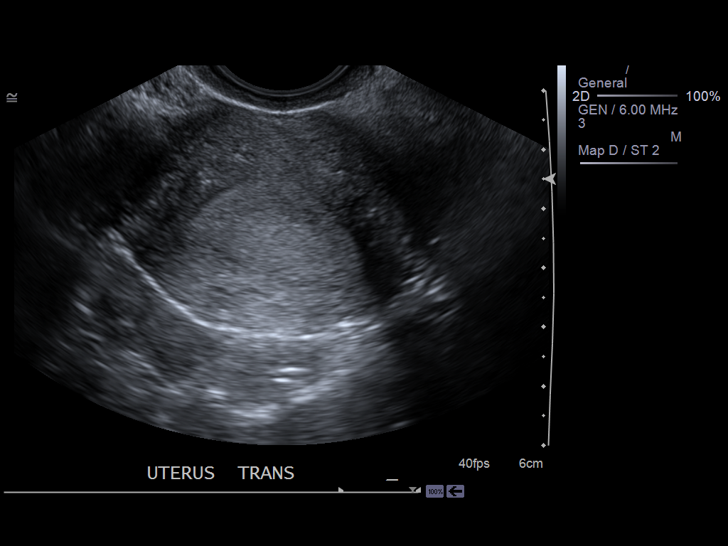
[im 51/81]
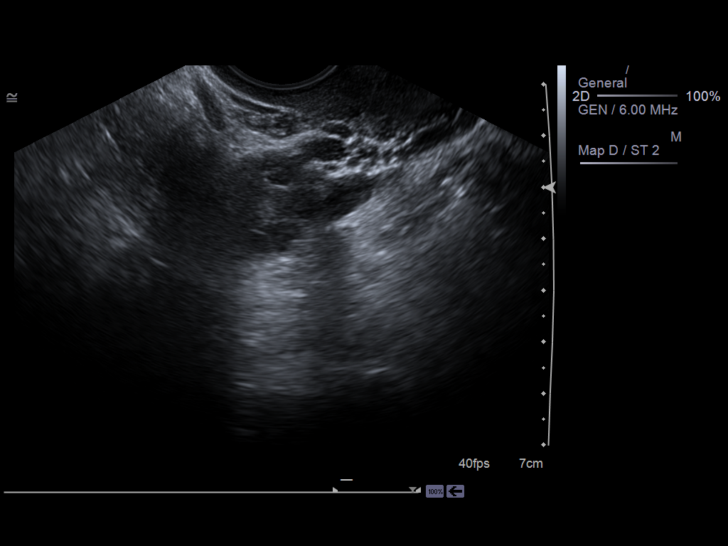
[im 54/81]
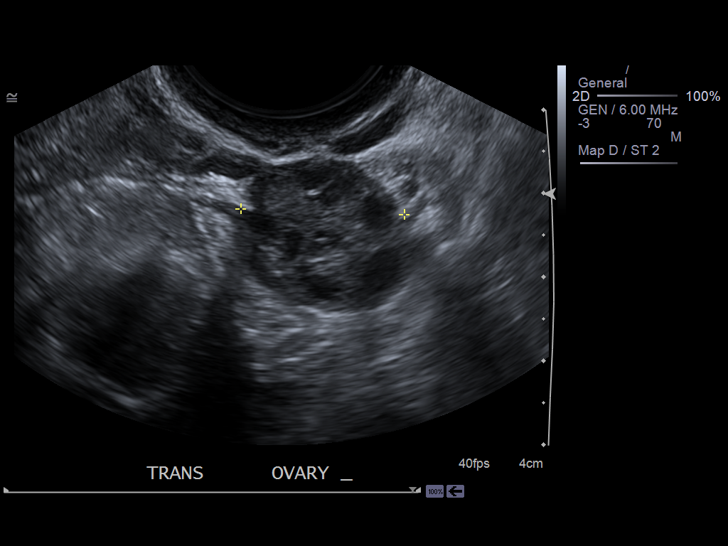
[im 61/81]
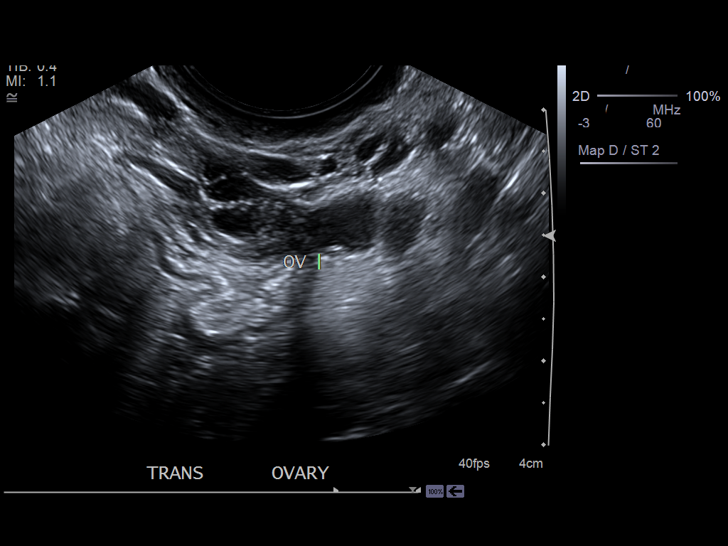
[im 67/81]
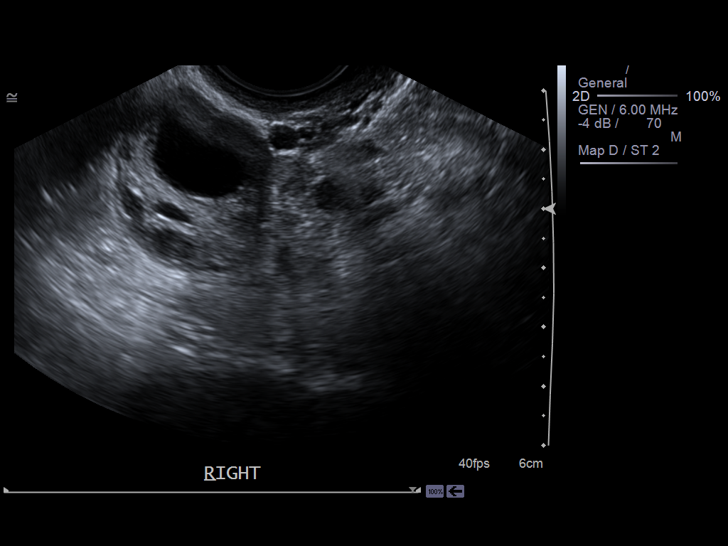
[im 74/81]
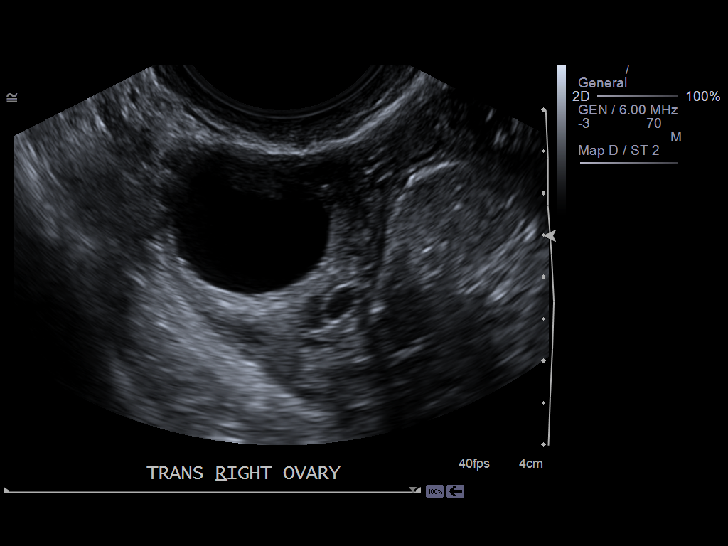
[im 81/81]
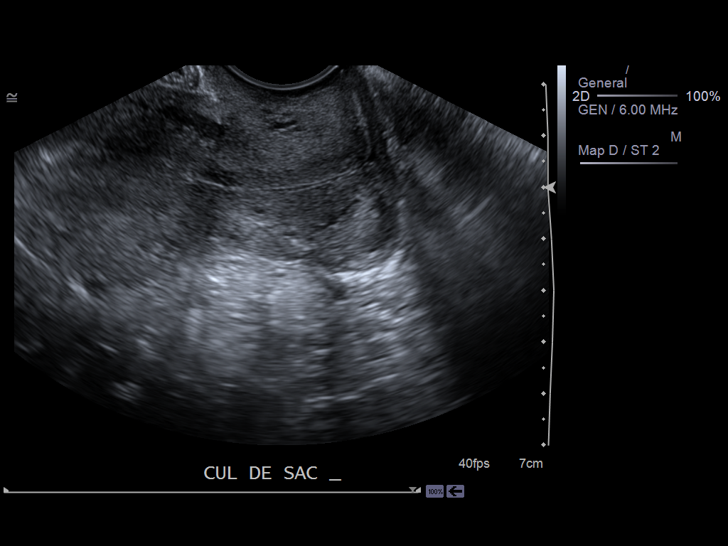

[14 of 25 positions shown; findings below may reference images not displayed]

FINDINGS: The uterus measures 8.45 x 3.99 x 5.48 cm and demonstrates
homogeneous echotexture and endometrial thickness of 8.6 mm. There is no
evidence of pelvic free fluid or loculated fluid collections. The right
ovary measures 3.07 x 2.84 x 2.77 cm. Dominant follicle is identified within
the right ovary. The left ovary measures 2.94 x 1.8 x 1.95 cm and a 1.28 x
0.77 x 0.93 cm, exophytic cyst is identified. Small follicle is identified
within the left ovary. Evaluation of the kidneys demonstrates pelviectasis
without evidence of hydronephrosis.
IMPRESSION: 1.  What appears to be a dominant follicle involving the right ovary and
measuring 2.03 x 1.71 x 1.99 cm. Exophytic cyst involving the left ovary.
2.  No further sonographic abnormalities.

## 2015-02-28 ENCOUNTER — Encounter: Payer: Self-pay | Admitting: Family Medicine

## 2015-03-01 MED ORDER — LEVOTHYROXINE SODIUM 112 MCG PO TABS: ORAL_TABLET | ORAL | Status: DC

## 2015-05-29 ENCOUNTER — Other Ambulatory Visit: Payer: Self-pay | Admitting: Family Medicine

## 2015-05-29 ENCOUNTER — Other Ambulatory Visit (INDEPENDENT_AMBULATORY_CARE_PROVIDER_SITE_OTHER): Payer: Commercial Managed Care - PPO

## 2015-05-29 DIAGNOSIS — Z0000 Encounter for general adult medical examination without abnormal findings: Secondary | ICD-10-CM | POA: Diagnosis not present

## 2015-05-29 DIAGNOSIS — E785 Hyperlipidemia, unspecified: Secondary | ICD-10-CM

## 2015-05-29 DIAGNOSIS — E039 Hypothyroidism, unspecified: Secondary | ICD-10-CM | POA: Diagnosis not present

## 2015-05-29 LAB — CBC WITH DIFFERENTIAL/PLATELET
BASOS PCT: 0.7 % (ref 0.0–3.0)
Basophils Absolute: 0 10*3/uL (ref 0.0–0.1)
Eosinophils Absolute: 0.2 10*3/uL (ref 0.0–0.7)
Eosinophils Relative: 2.3 % (ref 0.0–5.0)
HEMATOCRIT: 44 % (ref 36.0–46.0)
Hemoglobin: 15.3 g/dL — ABNORMAL HIGH (ref 12.0–15.0)
LYMPHS ABS: 2.2 10*3/uL (ref 0.7–4.0)
LYMPHS PCT: 32 % (ref 12.0–46.0)
MCHC: 34.7 g/dL (ref 30.0–36.0)
MCV: 87.4 fl (ref 78.0–100.0)
MONOS PCT: 7.9 % (ref 3.0–12.0)
Monocytes Absolute: 0.5 10*3/uL (ref 0.1–1.0)
NEUTROS ABS: 3.9 10*3/uL (ref 1.4–7.7)
NEUTROS PCT: 57.1 % (ref 43.0–77.0)
PLATELETS: 318 10*3/uL (ref 150.0–400.0)
RBC: 5.03 Mil/uL (ref 3.87–5.11)
RDW: 12.4 % (ref 11.5–15.5)
WBC: 6.8 10*3/uL (ref 4.0–10.5)

## 2015-05-29 LAB — COMPREHENSIVE METABOLIC PANEL
ALT: 12 U/L (ref 0–35)
AST: 17 U/L (ref 0–37)
Albumin: 4.3 g/dL (ref 3.5–5.2)
Alkaline Phosphatase: 44 U/L (ref 39–117)
BILIRUBIN TOTAL: 0.9 mg/dL (ref 0.2–1.2)
BUN: 17 mg/dL (ref 6–23)
CALCIUM: 9.5 mg/dL (ref 8.4–10.5)
CHLORIDE: 105 meq/L (ref 96–112)
CO2: 28 meq/L (ref 19–32)
Creatinine, Ser: 0.82 mg/dL (ref 0.40–1.20)
GFR: 86.87 mL/min (ref >60.00)
GLUCOSE: 78 mg/dL (ref 70–99)
POTASSIUM: 4.4 meq/L (ref 3.5–5.1)
Sodium: 139 mEq/L (ref 135–145)
Total Protein: 7.2 g/dL (ref 6.0–8.3)

## 2015-05-29 LAB — LIPID PANEL
CHOL/HDL RATIO: 3
Cholesterol: 158 mg/dL (ref 0–200)
HDL: 55 mg/dL (ref >39.00)
LDL CALC: 90 mg/dL (ref 0–99)
NONHDL: 102.92
TRIGLYCERIDES: 65 mg/dL (ref 0.0–149.0)
VLDL: 13 mg/dL (ref 0.0–40.0)

## 2015-05-29 LAB — T4, FREE: Free T4: 1.08 ng/dL (ref 0.60–1.60)

## 2015-05-29 LAB — TSH: TSH: 3.27 u[IU]/mL (ref 0.35–4.50)

## 2015-06-03 ENCOUNTER — Other Ambulatory Visit: Payer: Commercial Managed Care - PPO

## 2015-06-05 ENCOUNTER — Ambulatory Visit (INDEPENDENT_AMBULATORY_CARE_PROVIDER_SITE_OTHER): Payer: Commercial Managed Care - PPO | Admitting: Family Medicine

## 2015-06-05 VITALS — BP 122/68 | HR 56 | Temp 98.0°F | Ht 68.25 in | Wt 186.5 lb

## 2015-06-05 DIAGNOSIS — E785 Hyperlipidemia, unspecified: Secondary | ICD-10-CM | POA: Diagnosis not present

## 2015-06-05 DIAGNOSIS — Z309 Encounter for contraceptive management, unspecified: Secondary | ICD-10-CM | POA: Insufficient documentation

## 2015-06-05 DIAGNOSIS — Z308 Encounter for other contraceptive management: Secondary | ICD-10-CM | POA: Diagnosis not present

## 2015-06-05 DIAGNOSIS — Z0000 Encounter for general adult medical examination without abnormal findings: Secondary | ICD-10-CM | POA: Diagnosis not present

## 2015-06-05 DIAGNOSIS — E039 Hypothyroidism, unspecified: Secondary | ICD-10-CM | POA: Diagnosis not present

## 2015-06-05 MED ORDER — LEVOTHYROXINE SODIUM 112 MCG PO TABS: ORAL_TABLET | ORAL | Status: DC

## 2015-06-05 MED ORDER — NORETHIN ACE-ETH ESTRAD-FE 1-20 MG-MCG PO TABS: 1.0000 | ORAL_TABLET | Freq: Every day | ORAL | Status: DC

## 2015-06-05 NOTE — Assessment & Plan Note (Signed)
Diet controlled.  

## 2015-06-05 NOTE — Assessment & Plan Note (Signed)
Well controlled, euthyroid. No changes made today.

## 2015-06-05 NOTE — Progress Notes (Signed)
29 yo pleasant female here for CPX and follow up of chronic medical conditions.  Overall doing well.  Mom was diagnosed with lung CA in 11/2013 but she is actually doing very well.  Just finished chemo an dis cancer free. Positive pregnancy test when I saw her in 01/2015- she and her fiance decided to have an abortion. She is comfortable with this decision.  Pt has a GYN- Dr. Orvan Seen- pap UTD, 12/2014. Sexually active with fianceonly. She is interested in restarting OCPs for contraception.  HLD- has maintained her weight and controlling her cholesterol with diet alone and exercise alone now!  Wt Readings from Last 3 Encounters:  06/05/15 186 lb 8 oz (84.596 kg)  02/14/15 187 lb 4 oz (84.936 kg)  05/31/14 182 lb 4 oz (82.668 kg)   Lab Results  Component Value Date   CHOL 158 05/29/2015   HDL 55.00 05/29/2015   LDLCALC 90 05/29/2015   LDLDIRECT 162.9 07/06/2007   TRIG 65.0 05/29/2015   CHOLHDL 3 05/29/2015    Hypothyroidism- On Synthroid 112 mcg daily.  Denies any symptoms of hypo or hyperthyroidism.   Lab Results  Component Value Date   TSH 3.27 05/29/2015   Patient Active Problem List   Diagnosis Date Noted  . Contraception management 06/05/2015  . Positive pregnancy test 02/14/2015  . Heavy menses 04/19/2013  . Routine general medical examination at a health care facility 01/12/2011  . HLD (hyperlipidemia) 01/06/2010  . NEVI, MULTIPLE 12/16/2009  . MORBID OBESITY 12/16/2009  . Hypothyroidism 06/17/2007   Past Medical History  Diagnosis Date  . Hypothyroidism   . Morbid obesity    Past Surgical History  Procedure Laterality Date  . Appendectomy     Social History  Substance Use Topics  . Smoking status: Never Smoker   . Smokeless tobacco: Not on file  . Alcohol Use: Not on file   Family History  Problem Relation Age of Onset  . Hypertension Mother   . Cancer Mother 28    lung CA  . Hypertension Father    Allergies  Allergen Reactions  . Lidocaine    No  current outpatient prescriptions on file prior to visit.   No current facility-administered medications on file prior to visit.   The PMH, PSH, Social History, Family History, Medications, and allergies have been reviewed in [facility], and have been updated if relevant.     Review of Systems  Constitutional: Negative.   HENT: Negative.   Respiratory: Negative.   Cardiovascular: Negative.   Gastrointestinal: Negative.   Endocrine: Negative.   Genitourinary: Negative.   Musculoskeletal: Negative.   Skin: Negative.   Allergic/Immunologic: Negative.   Neurological: Negative.   Hematological: Negative.   Psychiatric/Behavioral: Negative.   All other systems reviewed and are negative.      Physical Exam BP 122/68 mmHg  Pulse 56  Temp(Src) 98 F (36.7 C) (Oral)  Ht 5' 8.25" (1.734 m)  Wt 186 lb 8 oz (84.596 kg)  BMI 28.14 kg/m2  SpO2 98%  LMP 06/01/2015 Wt Readings from Last 3 Encounters:  06/05/15 186 lb 8 oz (84.596 kg)  02/14/15 187 lb 4 oz (84.936 kg)  05/31/14 182 lb 4 oz (82.668 kg)    General:  Well-developed,well-nourished,in no acute distress; alert,appropriate and cooperative throughout examination  Head:  Normocephalic and atraumatic without obvious abnormalities. No apparent alopecia or balding. Eyes:  vision grossly intact, pupils equal, pupils round, pupils reactive to light, and no injection.   Ears:  External ear exam  shows no significant lesions or deformities.  Otoscopic examination reveals clear canals, tympanic membranes are intact bilaterally without bulging, retraction, inflammation or discharge. Hearing is grossly normal bilaterally. Nose:  External nasal examination shows no deformity or inflammation. Nasal mucosa are pink and moist without lesions or exudates. Mouth:  Oral mucosa and oropharynx without lesions or exudates.  Teeth in good repair. Lungs:  Normal respiratory effort, chest expands symmetrically. Lungs are clear to auscultation, no crackles  or wheezes. Heart:  normal rate and no murmur.   Abdomen:  Bowel sounds positive,abdomen soft and non-tender without masses, organomegaly or hernias noted. Msk:  normal ROM, no joint tenderness, no joint swelling, no joint warmth, no redness over joints, no joint deformities, no joint instability, and no crepitation.   Pulses:  R popliteal normal, R posterior tibial normal, R dorsalis pedis normal, L posterior tibial normal, L dorsalis pedis normal, and L carotid normal.   Extremities:  No clubbing, cyanosis, edema, or deformity noted with normal full range of motion of all joints.   Neurologic:  No cranial nerve deficits noted. Station and gait are normal. Plantar reflexes are down-going bilaterally. DTRs are symmetrical throughout. Sensory, motor and coordinative functions appear intact. Psych:  Cognition and judgment appear intact. Alert and cooperative with normal attention span and concentration. No apparent delusions, illusions, hallucinations

## 2015-06-05 NOTE — Progress Notes (Signed)
Pre visit review using our clinic review tool, if applicable. No additional management support is needed unless otherwise documented below in the visit note. 

## 2015-06-05 NOTE — Patient Instructions (Signed)
Great to see you. Please keep me updated with how your birth control pills are working.

## 2015-06-05 NOTE — Assessment & Plan Note (Signed)
Education given regarding options for contraception, including barrier methods, injectable contraception, IUD placement, oral contraceptives  She would like to start OCPs- eRx sent for Losterin.  She will update me.

## 2015-06-05 NOTE — Assessment & Plan Note (Signed)
Reviewed preventive care protocols, scheduled due services, and updated immunizations Discussed nutrition, exercise, diet, and healthy lifestyle.  Declines flu vaccine today.

## 2015-06-17 ENCOUNTER — Encounter: Payer: Self-pay | Admitting: Family Medicine

## 2015-06-24 NOTE — Telephone Encounter (Signed)
Form mailed to pt as requested

## 2015-07-19 ENCOUNTER — Encounter: Payer: Self-pay | Admitting: Family Medicine

## 2015-10-17 ENCOUNTER — Other Ambulatory Visit: Payer: Self-pay | Admitting: Family Medicine

## 2015-10-17 ENCOUNTER — Encounter: Payer: Self-pay | Admitting: Family Medicine

## 2015-10-17 MED ORDER — NORGESTIMATE-ETH ESTRADIOL 0.25-35 MG-MCG PO TABS: 1.0000 | ORAL_TABLET | Freq: Every day | ORAL | Status: DC

## 2016-05-29 ENCOUNTER — Other Ambulatory Visit: Payer: Self-pay | Admitting: Family Medicine

## 2016-05-29 DIAGNOSIS — E039 Hypothyroidism, unspecified: Secondary | ICD-10-CM

## 2016-05-29 DIAGNOSIS — E785 Hyperlipidemia, unspecified: Secondary | ICD-10-CM

## 2016-05-29 DIAGNOSIS — Z0000 Encounter for general adult medical examination without abnormal findings: Secondary | ICD-10-CM

## 2016-06-04 ENCOUNTER — Other Ambulatory Visit (INDEPENDENT_AMBULATORY_CARE_PROVIDER_SITE_OTHER): Payer: Commercial Managed Care - PPO

## 2016-06-04 DIAGNOSIS — E039 Hypothyroidism, unspecified: Secondary | ICD-10-CM

## 2016-06-04 DIAGNOSIS — Z0000 Encounter for general adult medical examination without abnormal findings: Secondary | ICD-10-CM

## 2016-06-04 DIAGNOSIS — E785 Hyperlipidemia, unspecified: Secondary | ICD-10-CM | POA: Diagnosis not present

## 2016-06-04 LAB — COMPREHENSIVE METABOLIC PANEL
ALBUMIN: 4.2 g/dL (ref 3.5–5.2)
ALK PHOS: 45 U/L (ref 39–117)
ALT: 14 U/L (ref 0–35)
AST: 19 U/L (ref 0–37)
BUN: 21 mg/dL (ref 6–23)
CALCIUM: 9.5 mg/dL (ref 8.4–10.5)
CO2: 26 mEq/L (ref 19–32)
Chloride: 104 mEq/L (ref 96–112)
Creatinine, Ser: 1.03 mg/dL (ref 0.40–1.20)
GFR: 66.32 mL/min (ref >60.00)
Glucose, Bld: 71 mg/dL (ref 70–99)
POTASSIUM: 4.7 meq/L (ref 3.5–5.1)
Sodium: 139 mEq/L (ref 135–145)
TOTAL PROTEIN: 7.3 g/dL (ref 6.0–8.3)
Total Bilirubin: 0.6 mg/dL (ref 0.2–1.2)

## 2016-06-04 LAB — CBC WITH DIFFERENTIAL/PLATELET
BASOS PCT: 0.7 % (ref 0.0–3.0)
Basophils Absolute: 0.1 10*3/uL (ref 0.0–0.1)
EOS PCT: 1.6 % (ref 0.0–5.0)
Eosinophils Absolute: 0.1 10*3/uL (ref 0.0–0.7)
HEMATOCRIT: 44.3 % (ref 36.0–46.0)
HEMOGLOBIN: 15.4 g/dL — AB (ref 12.0–15.0)
Lymphocytes Relative: 26.4 % (ref 12.0–46.0)
Lymphs Abs: 2 10*3/uL (ref 0.7–4.0)
MCHC: 34.6 g/dL (ref 30.0–36.0)
MCV: 88.2 fl (ref 78.0–100.0)
MONOS PCT: 8 % (ref 3.0–12.0)
Monocytes Absolute: 0.6 10*3/uL (ref 0.1–1.0)
Neutro Abs: 4.7 10*3/uL (ref 1.4–7.7)
Neutrophils Relative %: 63.3 % (ref 43.0–77.0)
Platelets: 334 10*3/uL (ref 150.0–400.0)
RBC: 5.03 Mil/uL (ref 3.87–5.11)
RDW: 12.3 % (ref 11.5–15.5)
WBC: 7.4 10*3/uL (ref 4.0–10.5)

## 2016-06-04 LAB — LIPID PANEL
CHOLESTEROL: 175 mg/dL (ref 0–200)
HDL: 54.3 mg/dL (ref >39.00)
LDL Cholesterol: 107 mg/dL — ABNORMAL HIGH (ref 0–99)
NonHDL: 121.02
TRIGLYCERIDES: 70 mg/dL (ref 0.0–149.0)
Total CHOL/HDL Ratio: 3
VLDL: 14 mg/dL (ref 0.0–40.0)

## 2016-06-04 LAB — T4, FREE: Free T4: 1.45 ng/dL (ref 0.60–1.60)

## 2016-06-04 LAB — TSH: TSH: 2.51 u[IU]/mL (ref 0.35–4.50)

## 2016-06-10 ENCOUNTER — Encounter: Payer: Self-pay | Admitting: Family Medicine

## 2016-06-10 ENCOUNTER — Ambulatory Visit (INDEPENDENT_AMBULATORY_CARE_PROVIDER_SITE_OTHER): Payer: Commercial Managed Care - PPO | Admitting: Family Medicine

## 2016-06-10 VITALS — BP 114/80 | HR 75 | Temp 97.8°F | Ht 68.25 in | Wt 188.5 lb

## 2016-06-10 DIAGNOSIS — E039 Hypothyroidism, unspecified: Secondary | ICD-10-CM

## 2016-06-10 DIAGNOSIS — Z0000 Encounter for general adult medical examination without abnormal findings: Secondary | ICD-10-CM

## 2016-06-10 DIAGNOSIS — E785 Hyperlipidemia, unspecified: Secondary | ICD-10-CM

## 2016-06-10 DIAGNOSIS — Z308 Encounter for other contraceptive management: Secondary | ICD-10-CM

## 2016-06-10 MED ORDER — LEVOTHYROXINE SODIUM 112 MCG PO TABS: ORAL_TABLET | ORAL | 3 refills | Status: DC

## 2016-06-10 NOTE — Progress Notes (Signed)
Pre visit review using our clinic review tool, if applicable. No additional management support is needed unless otherwise documented below in the visit note. 

## 2016-06-10 NOTE — Assessment & Plan Note (Signed)
Controlled with diet and exercise 

## 2016-06-10 NOTE — Progress Notes (Signed)
30 yo pleasant female here for CPX and follow up of chronic medical conditions.   Pt has a GYN- Dr. Orvan Seen- pap UTD, 12/2015. Sexually active with fiance only.   HLD- has maintained her weight and controlling her cholesterol with diet alone and exercise alone now!  [place] [employer] is a month away. She is excited about it.  Wt Readings from Last 3 Encounters:  06/10/16 188 lb 8 oz (85.5 kg)  06/05/15 186 lb 8 oz (84.6 kg)  02/14/15 187 lb 4 oz (84.9 kg)   Lab Results  Component Value Date   CHOL 175 06/04/2016   HDL 54.30 06/04/2016   LDLCALC 107 (H) 06/04/2016   LDLDIRECT 162.9 07/06/2007   TRIG 70.0 06/04/2016   CHOLHDL 3 06/04/2016    Hypothyroidism- On Synthroid 112 mcg daily.  Denies any symptoms of hypo or hyperthyroidism.   Lab Results  Component Value Date   TSH 2.51 06/04/2016   Patient Active Problem List  Diagnosis  . NEVI, MULTIPLE  . Hypothyroidism  . HLD (hyperlipidemia)  . Routine general medical examination at a health care facility  . Heavy menses  . Contraception management   Past Medical History:  Diagnosis Date  . Hypothyroidism   . Morbid obesity ([place])    Past Surgical History:  Procedure Laterality Date  . APPENDECTOMY     Social History  Substance Use Topics  . Smoking status: Never Smoker  . Smokeless tobacco: Not on file  . Alcohol use Not on file   Family History  Problem Relation Age of Onset  . Hypertension Mother   . Cancer Mother 21    lung CA  . Hypertension Father    Allergies  Allergen Reactions  . Lidocaine    No current outpatient prescriptions on file prior to visit.   No current facility-administered medications on file prior to visit.    The PMH, PSH, Social History, Family History, Medications, and allergies have been reviewed in [facility], and have been updated if relevant.     Review of Systems  Constitutional: Negative.   HENT: Negative.   Respiratory: Negative.   Cardiovascular: Negative.   Gastrointestinal:  Negative.   Endocrine: Negative.   Genitourinary: Negative.   Musculoskeletal: Negative.   Skin: Negative.   Allergic/Immunologic: Negative.   Neurological: Negative.   Hematological: Negative.   Psychiatric/Behavioral: Negative.   All other systems reviewed and are negative.      Physical Exam BP 114/80   Pulse 75   Temp 97.8 F (36.6 C) (Oral)   Ht 5' 8.25" (1.734 m)   Wt 188 lb 8 oz (85.5 kg)   LMP 06/10/2016   SpO2 98%   BMI 28.45 kg/m  Wt Readings from Last 3 Encounters:  06/10/16 188 lb 8 oz (85.5 kg)  06/05/15 186 lb 8 oz (84.6 kg)  02/14/15 187 lb 4 oz (84.9 kg)    General:  Well-developed,well-nourished,in no acute distress; alert,appropriate and cooperative throughout examination  Head:  Normocephalic and atraumatic without obvious abnormalities. No apparent alopecia or balding. Eyes:  vision grossly intact, pupils equal, pupils round, pupils reactive to light, and no injection.   Ears:  External ear exam shows no significant lesions or deformities.  Otoscopic examination reveals clear canals, tympanic membranes are intact bilaterally without bulging, retraction, inflammation or discharge. Hearing is grossly normal bilaterally. Nose:  External nasal examination shows no deformity or inflammation. Nasal mucosa are pink and moist without lesions or exudates. Mouth:  Oral mucosa and oropharynx without lesions or  exudates.  Teeth in good repair. Lungs:  Normal respiratory effort, chest expands symmetrically. Lungs are clear to auscultation, no crackles or wheezes. Heart:  normal rate and no murmur.   Abdomen:  Bowel sounds positive,abdomen soft and non-tender without masses, organomegaly or hernias noted. Msk:  normal ROM, no joint tenderness, no joint swelling, no joint warmth, no redness over joints, no joint deformities, no joint instability, and no crepitation.   Pulses:  R popliteal normal, R posterior tibial normal, R dorsalis pedis normal, L posterior tibial  normal, L dorsalis pedis normal, and L carotid normal.   Extremities:  No clubbing, cyanosis, edema, or deformity noted with normal full range of motion of all joints.   Neurologic:  No cranial nerve deficits noted. Station and gait are normal. Plantar reflexes are down-going bilaterally. DTRs are symmetrical throughout. Sensory, motor and coordinative functions appear intact. Psych:  Cognition and judgment appear intact. Alert and cooperative with normal attention span and concentration. No apparent delusions, illusions, hallucinations

## 2016-06-10 NOTE — Assessment & Plan Note (Signed)
Well controlled. Euthyroid. No changes made today.

## 2016-06-10 NOTE — Assessment & Plan Note (Signed)
Reviewed preventive care protocols, scheduled due services, and updated immunizations Discussed nutrition, exercise, diet, and healthy lifestyle.  

## 2016-06-10 NOTE — Patient Instructions (Signed)
Great to see you. Congratulations on your wedding!  I look forward to hearing all about it and seeing the pictures!

## 2016-07-20 ENCOUNTER — Ambulatory Visit
Admission: EM | Admit: 2016-07-20 | Discharge: 2016-07-20 | Disposition: A | Discharge status: Home / Self Care | Payer: Commercial Managed Care - PPO | Attending: Family Medicine | Admitting: Family Medicine

## 2016-07-20 DIAGNOSIS — J0110 Acute frontal sinusitis, unspecified: Secondary | ICD-10-CM

## 2016-07-20 DIAGNOSIS — J0100 Acute maxillary sinusitis, unspecified: Secondary | ICD-10-CM

## 2016-07-20 MED ORDER — AMOXICILLIN-POT CLAVULANATE 875-125 MG PO TABS: 1.0000 | ORAL_TABLET | Freq: Two times a day (BID) | ORAL | 0 refills | Status: DC

## 2016-07-20 NOTE — ED Triage Notes (Signed)
Patient complains of sinus pain and pressure with headaches. Patient reports that symptoms started over 1 week ago. Patient states that she thought that she was improving but has since worsened. Patient reports that she has tried dayquil and mucinex without relief.

## 2016-07-20 NOTE — Discharge Instructions (Signed)
Take medication as prescribed. Rest. Drink plenty of fluids. Take over the counter Claritin D as discussed.   Follow up with your primary care physician this week as needed. Return to Urgent care for new or worsening concerns.

## 2016-07-20 NOTE — ED Provider Notes (Signed)
[facility] ____________________________________________  Time seen: Approximately 9:23 AM  I have reviewed the triage vital signs and the nursing notes.   HISTORY  Chief Complaint Sinusitis   HPI Holly Singleton is a 30 y.o. female presents for complaints 1.5 weeks of runny nose, nasal congestion, sinus pressure and sinus drainage. Patient reports intermittent cough. Patient states cough is primarily with postnasal drainage and worse at night. Reports symptoms were starting gait to get better with over-the-counter DayQuil and Mucinex but then worsened again. Patient reports constant sinus pressure. Denies density contacts. Denies recent sickness or recent antibiotic use. Denies fevers denies vision changes, dizziness, weakness, neck pain, back pain, chest pain, shortness of breath, abdominal pain, dysuria or other complaints.  Patient's last menstrual period was 07/06/2016.Denies pregnancy.   Past Medical History:  Diagnosis Date  . Hypothyroidism   . Morbid obesity [facility])     Patient Active Problem List   Diagnosis Date Noted  . Contraception management 06/05/2015  . Heavy menses 04/19/2013  . Routine general medical examination at a health care facility 01/12/2011  . HLD (hyperlipidemia) 01/06/2010  . NEVI, MULTIPLE 12/16/2009  . Hypothyroidism 06/17/2007    Past Surgical History:  Procedure Laterality Date  . APPENDECTOMY      Current Outpatient Rx  . Order #: [id number] Class: Normal  . Order #: [id number] Class: Normal    No current facility-administered medications for this encounter.   Current Outpatient Prescriptions:  .  levothyroxine (SYNTHROID, LEVOTHROID) 112 MCG tablet, TAKE 1 TABLET BY MOUTH EVERY DAY, Disp: 90 tablet, Rfl: 3 .  amoxicillin-clavulanate (AUGMENTIN) 875-125 MG tablet, Take 1 tablet by mouth every 12 (twelve) hours., Disp: 20 tablet, Rfl: 0  Allergies Lidocaine  Family History  Problem Relation Age of Onset  . Hypertension  Mother   . Cancer Mother 54    lung CA  . Hypertension Father     Social History Social History  Substance Use Topics  . Smoking status: Never Smoker  . Smokeless tobacco: Never Used  . Alcohol use Yes     Comment: occasionally    Review of Systems Constitutional: No fever/chills Eyes: No visual changes. ENT: No sore throat. Cardiovascular: Denies chest pain. Respiratory: Denies shortness of breath. Gastrointestinal: No abdominal pain.  No nausea, no vomiting.  No diarrhea.  No constipation. Genitourinary: Negative for dysuria. Musculoskeletal: Negative for back pain. Skin: Negative for rash. Neurological: Negative for headaches, focal weakness or numbness.  10-point ROS otherwise negative.  ____________________________________________   PHYSICAL EXAM:  VITAL SIGNS: ED Triage Vitals  Enc Vitals Group     BP 07/20/16 0908 116/80     Pulse Rate 07/20/16 0908 91     Resp 07/20/16 0908 17     Temp 07/20/16 0908 98.3 F (36.8 C)     Temp Source 07/20/16 0908 Oral     SpO2 07/20/16 0908 99 %     Weight 07/20/16 0905 185 lb (83.9 kg)     Height 07/20/16 0905 5\' 8"  (1.727 m)     Head Circumference --      Peak Flow --      Pain Score 07/20/16 0908 8     Pain Loc --      Pain Edu? --      Excl. in [place]? --    Constitutional: Alert and oriented. Well appearing and in no acute distress. Eyes: Conjunctivae are normal. PERRL. EOMI. Head: Atraumatic.Mild to moderate tenderness to palpation bilateral frontal and maxillary sinuses, left side greater than  right. No swelling. No erythema.   Ears: no erythema, normal TMs bilaterally.   Nose: nasal congestion with bilateral nasal turbinate erythema and edema.   Mouth/Throat: Mucous membranes are moist.  Oropharynx non-erythematous.No tonsillar swelling or exudate.  Neck: No stridor.  No cervical spine tenderness to palpation. Hematological/Lymphatic/Immunilogical: No cervical lymphadenopathy. Cardiovascular: Normal rate,  regular rhythm. Grossly normal heart sounds.  Good peripheral circulation. Respiratory: Normal respiratory effort.  No retractions. Lungs CTAB. No wheezes, rales or rhonchi. Good air movement.  Gastrointestinal: Soft and nontender. No distention. Musculoskeletal: No lower or upper extremity tenderness nor edema. No cervical, thoracic or lumbar tenderness to palpation.  Neurologic:  Normal speech and language. No gross focal neurologic deficits are appreciated. No gait instability. Skin:  Skin is warm, dry and intact. No rash noted. Psychiatric: Mood and affect are normal. Speech and behavior are normal.  ___________________________________________   LABS (all labs ordered are listed, but only abnormal results are displayed)  Labs Reviewed - No data to display   PROCEDURES Procedures    INITIAL IMPRESSION / ASSESSMENT AND PLAN / ED COURSE  Pertinent labs & imaging results that were available during my care of the patient were reviewed by me and considered in my medical decision making (see chart for details).  Well-appearing patient. No acute distress. Suspect frontal and maxillary sinusitis. Will treat patient with oral Augmentin and recommended over-the-counter Claritin-D. Encourage rest, fluids and supportive care.Discussed indication, risks and benefits of medications with patient.  Discussed follow up with Primary care physician this week. Discussed follow up and return parameters including no resolution or any worsening concerns. Patient verbalized understanding and agreed to plan.   ____________________________________________   FINAL CLINICAL IMPRESSION(S) / ED DIAGNOSES  Final diagnoses:  Acute frontal sinusitis, recurrence not specified  Acute maxillary sinusitis, recurrence not specified     Discharge Medication List as of 07/20/2016  9:33 AM    START taking these medications   Details  amoxicillin-clavulanate (AUGMENTIN) 875-125 MG tablet Take 1 tablet by mouth  every 12 (twelve) hours., Starting Mon 07/20/2016, Normal        Note: This dictation was prepared with Dragon dictation along with smaller phrase technology. Any transcriptional errors that result from this process are unintentional.    Clinical Course       Marylene Land, NP 07/20/16 [phone]

## 2016-10-16 ENCOUNTER — Encounter: Payer: Self-pay | Admitting: Family Medicine

## 2016-10-16 ENCOUNTER — Other Ambulatory Visit: Payer: Self-pay | Admitting: Family Medicine

## 2016-10-16 DIAGNOSIS — F32A Depression, unspecified: Secondary | ICD-10-CM

## 2016-10-16 DIAGNOSIS — F329 Major depressive disorder, single episode, unspecified: Secondary | ICD-10-CM

## 2016-10-16 DIAGNOSIS — F419 Anxiety disorder, unspecified: Principal | ICD-10-CM

## 2016-10-26 ENCOUNTER — Ambulatory Visit (INDEPENDENT_AMBULATORY_CARE_PROVIDER_SITE_OTHER): Payer: Commercial Managed Care - PPO | Admitting: Psychology

## 2016-10-26 DIAGNOSIS — F321 Major depressive disorder, single episode, moderate: Secondary | ICD-10-CM

## 2016-11-10 ENCOUNTER — Ambulatory Visit: Payer: Commercial Managed Care - PPO | Admitting: Psychology

## 2016-12-07 ENCOUNTER — Ambulatory Visit: Payer: Commercial Managed Care - PPO | Admitting: Psychology

## 2017-01-07 ENCOUNTER — Ambulatory Visit: Payer: Commercial Managed Care - PPO | Admitting: Psychology

## 2017-02-08 ENCOUNTER — Ambulatory Visit (INDEPENDENT_AMBULATORY_CARE_PROVIDER_SITE_OTHER): Payer: Commercial Managed Care - PPO | Admitting: Psychology

## 2017-02-08 DIAGNOSIS — F321 Major depressive disorder, single episode, moderate: Secondary | ICD-10-CM | POA: Diagnosis not present

## 2017-02-09 ENCOUNTER — Ambulatory Visit (INDEPENDENT_AMBULATORY_CARE_PROVIDER_SITE_OTHER): Payer: Commercial Managed Care - PPO | Admitting: Family Medicine

## 2017-02-09 ENCOUNTER — Encounter: Payer: Self-pay | Admitting: Family Medicine

## 2017-02-09 VITALS — BP 120/70 | HR 75 | Ht 68.25 in | Wt 208.0 lb

## 2017-02-09 DIAGNOSIS — N912 Amenorrhea, unspecified: Secondary | ICD-10-CM | POA: Diagnosis not present

## 2017-02-09 DIAGNOSIS — Z3201 Encounter for pregnancy test, result positive: Secondary | ICD-10-CM | POA: Diagnosis not present

## 2017-02-09 LAB — POCT URINE PREGNANCY: Preg Test, Ur: POSITIVE — AB

## 2017-02-09 NOTE — Assessment & Plan Note (Signed)
Positive U preg here as well. Advised starting PNV right away, continue synthroid. Has appt with OB for next week.

## 2017-02-09 NOTE — Progress Notes (Signed)
Pre visit review using our clinic review tool, if applicable. No additional management support is needed unless otherwise documented below in the visit note. 

## 2017-02-09 NOTE — Progress Notes (Signed)
   Subjective:   Patient ID: Holly Singleton, female    DOB: 1985-12-24, 31 y.o.   MRN: [id number]  Holly Singleton is a pleasant 31 y.o. year old female who presents to clinic today with [place]  on 02/09/2017  HPI:  LMP 01/02/17   5 positive HPTs.  Wanted to come here to confirm pregnancy.  Has had some fatigue and breast tenderness.  No nausea or vaginal bleeding or cramping.   Current Outpatient Prescriptions on File Prior to Visit  Medication Sig Dispense Refill  . levothyroxine (SYNTHROID, LEVOTHROID) 112 MCG tablet TAKE 1 TABLET BY MOUTH EVERY DAY 90 tablet 3   No current facility-administered medications on file prior to visit.     Allergies  Allergen Reactions  . Lidocaine     Past Medical History:  Diagnosis Date  . Hypothyroidism   . Morbid obesity ([place])     Past Surgical History:  Procedure Laterality Date  . APPENDECTOMY      Family History  Problem Relation Age of Onset  . Hypertension Mother   . Cancer Mother 21       lung CA  . Hypertension Father     Social History   Social History  . Marital status: Divorced    Spouse name: N/A  . Number of children: N/A  . Years of education: N/A   Occupational History  . [employer]   Social History Main Topics  . Smoking status: Never Smoker  . Smokeless tobacco: Never Used  . Alcohol use Yes     Comment: occasionally  . Drug use: No  . Sexual activity: Not on file   Other Topics Concern  . Not on file   Social History Narrative   Recently divorced.   The PMH, PSH, Social History, Family History, Medications, and allergies have been reviewed in [facility], and have been updated if relevant.   Review of Systems  Constitutional: Positive for fatigue.  Gastrointestinal: Positive for nausea. Negative for vomiting.  Genitourinary: Negative.        Objective:    BP 120/70   Pulse 75   Ht 5' 8.25" (1.734 m)   Wt 208 lb (94.3 kg)   LMP 01/01/2017   SpO2 98%   BMI  31.40 kg/m    Physical Exam  Constitutional: She is oriented to person, place, and time. She appears well-developed and well-nourished. No distress.  HENT:  Head: Normocephalic and atraumatic.  Eyes: Conjunctivae are normal.  Cardiovascular: Normal rate.   Pulmonary/Chest: Effort normal.  Neurological: She is alert and oriented to person, place, and time. No cranial nerve deficit.  Skin: Skin is warm and dry. She is not diaphoretic.  Psychiatric: She has a normal mood and affect. Her behavior is normal. Judgment and thought content normal.  Nursing note and vitals reviewed.         Assessment & Plan:   Amenorrhea No Follow-up on file.

## 2017-02-25 DIAGNOSIS — N911 Secondary amenorrhea: Secondary | ICD-10-CM | POA: Diagnosis not present

## 2017-03-04 LAB — OB RESULTS CONSOLE ANTIBODY SCREEN: ANTIBODY SCREEN: NEGATIVE

## 2017-03-04 LAB — OB RESULTS CONSOLE ABO/RH: RH Type: POSITIVE

## 2017-03-04 LAB — OB RESULTS CONSOLE RUBELLA ANTIBODY, IGM: RUBELLA: IMMUNE

## 2017-03-04 LAB — OB RESULTS CONSOLE HIV ANTIBODY (ROUTINE TESTING): HIV: NONREACTIVE

## 2017-03-04 LAB — OB RESULTS CONSOLE RPR: RPR: NONREACTIVE

## 2017-03-04 LAB — OB RESULTS CONSOLE HEPATITIS B SURFACE ANTIGEN: Hepatitis B Surface Ag: NEGATIVE

## 2017-03-04 LAB — OB RESULTS CONSOLE GC/CHLAMYDIA
CHLAMYDIA, DNA PROBE: NEGATIVE
GC PROBE AMP, GENITAL: NEGATIVE

## 2017-03-05 ENCOUNTER — Ambulatory Visit (INDEPENDENT_AMBULATORY_CARE_PROVIDER_SITE_OTHER): Payer: Commercial Managed Care - PPO | Admitting: Psychology

## 2017-03-05 DIAGNOSIS — F321 Major depressive disorder, single episode, moderate: Secondary | ICD-10-CM

## 2017-03-16 DIAGNOSIS — Z13228 Encounter for screening for other metabolic disorders: Secondary | ICD-10-CM | POA: Diagnosis not present

## 2017-04-19 DIAGNOSIS — E039 Hypothyroidism, unspecified: Secondary | ICD-10-CM | POA: Diagnosis not present

## 2017-04-22 ENCOUNTER — Ambulatory Visit (INDEPENDENT_AMBULATORY_CARE_PROVIDER_SITE_OTHER): Payer: Commercial Managed Care - PPO | Admitting: Psychology

## 2017-04-22 DIAGNOSIS — F321 Major depressive disorder, single episode, moderate: Secondary | ICD-10-CM | POA: Diagnosis not present

## 2017-05-25 ENCOUNTER — Other Ambulatory Visit: Payer: Self-pay | Admitting: Family Medicine

## 2017-05-25 ENCOUNTER — Telehealth: Payer: Self-pay

## 2017-05-25 NOTE — Telephone Encounter (Signed)
Congratulations and yes, I will refill thyroid med and she can cancel CPX with me!

## 2017-05-25 NOTE — Telephone Encounter (Signed)
Pt left v/m; pt wants to cancel 12/02/17 cpx since pt is pregnant and OB GYN is taking care of physical for now. Is it OK to cancel CPX. Also OB GYN faxed over recent lab results for thyroid testing. Pt wants to know if Dr [name] will refill thyroid med or not. Pt request cb.

## 2017-05-25 NOTE — Telephone Encounter (Signed)
Last office visit 06/04/16, patient pregnant Last filled 02/18/17 No office 12/02/17

## 2017-05-26 MED ORDER — LEVOTHYROXINE SODIUM 112 MCG PO TABS: 112.0000 ug | ORAL_TABLET | Freq: Every day | ORAL | 3 refills | Status: DC

## 2017-05-26 NOTE — Telephone Encounter (Signed)
Cancelled appt, rx refilled.

## 2017-05-31 ENCOUNTER — Ambulatory Visit (INDEPENDENT_AMBULATORY_CARE_PROVIDER_SITE_OTHER): Payer: Commercial Managed Care - PPO | Admitting: Psychology

## 2017-05-31 DIAGNOSIS — F321 Major depressive disorder, single episode, moderate: Secondary | ICD-10-CM

## 2017-06-03 ENCOUNTER — Encounter: Payer: Self-pay | Admitting: Family Medicine

## 2017-06-03 ENCOUNTER — Other Ambulatory Visit: Payer: Self-pay | Admitting: Family Medicine

## 2017-06-03 MED ORDER — LEVOTHYROXINE SODIUM 112 MCG PO TABS: 112.0000 ug | ORAL_TABLET | Freq: Every day | ORAL | 3 refills | Status: DC

## 2017-06-09 ENCOUNTER — Other Ambulatory Visit: Payer: Commercial Managed Care - PPO

## 2017-06-14 ENCOUNTER — Encounter: Payer: Commercial Managed Care - PPO | Admitting: Family Medicine

## 2017-06-15 ENCOUNTER — Ambulatory Visit (INDEPENDENT_AMBULATORY_CARE_PROVIDER_SITE_OTHER): Payer: Commercial Managed Care - PPO | Admitting: Psychology

## 2017-06-15 DIAGNOSIS — F321 Major depressive disorder, single episode, moderate: Secondary | ICD-10-CM | POA: Diagnosis not present

## 2017-06-17 DIAGNOSIS — Z23 Encounter for immunization: Secondary | ICD-10-CM | POA: Diagnosis not present

## 2017-06-28 ENCOUNTER — Ambulatory Visit (INDEPENDENT_AMBULATORY_CARE_PROVIDER_SITE_OTHER): Payer: Commercial Managed Care - PPO | Admitting: Psychology

## 2017-06-28 DIAGNOSIS — F321 Major depressive disorder, single episode, moderate: Secondary | ICD-10-CM | POA: Diagnosis not present

## 2017-07-08 ENCOUNTER — Inpatient Hospital Stay (HOSPITAL_COMMUNITY)
Admission: AD | Admit: 2017-07-08 | Payer: Commercial Managed Care - PPO | Source: Ambulatory Visit | Admitting: Obstetrics and Gynecology

## 2017-07-12 DIAGNOSIS — Z23 Encounter for immunization: Secondary | ICD-10-CM | POA: Diagnosis not present

## 2017-07-13 ENCOUNTER — Ambulatory Visit (INDEPENDENT_AMBULATORY_CARE_PROVIDER_SITE_OTHER): Payer: Commercial Managed Care - PPO | Admitting: Psychology

## 2017-07-13 DIAGNOSIS — F321 Major depressive disorder, single episode, moderate: Secondary | ICD-10-CM

## 2017-07-29 ENCOUNTER — Ambulatory Visit (INDEPENDENT_AMBULATORY_CARE_PROVIDER_SITE_OTHER): Payer: Commercial Managed Care - PPO | Admitting: Psychology

## 2017-07-29 DIAGNOSIS — F321 Major depressive disorder, single episode, moderate: Secondary | ICD-10-CM

## 2017-08-03 DIAGNOSIS — E039 Hypothyroidism, unspecified: Secondary | ICD-10-CM | POA: Diagnosis not present

## 2017-08-12 ENCOUNTER — Ambulatory Visit (INDEPENDENT_AMBULATORY_CARE_PROVIDER_SITE_OTHER): Payer: Commercial Managed Care - PPO | Admitting: Psychology

## 2017-08-12 DIAGNOSIS — F321 Major depressive disorder, single episode, moderate: Secondary | ICD-10-CM

## 2017-08-26 ENCOUNTER — Ambulatory Visit (INDEPENDENT_AMBULATORY_CARE_PROVIDER_SITE_OTHER): Payer: Commercial Managed Care - PPO | Admitting: Psychology

## 2017-08-26 DIAGNOSIS — F321 Major depressive disorder, single episode, moderate: Secondary | ICD-10-CM

## 2017-08-31 NOTE — L&D Delivery Note (Deleted)
Delivery Note  SVD viable female Apgars 9,9 over 2nd degree ML Lac.  Placenta delivered spontaneously intact with 3VC. Repair with 2-0 Chromic with good support and hemostasis noted.  R/V exam confirms.  PH art was sent.   Mother and baby to couplet care and are doing well.  EBL 300cc  Louretta Shorten, MD

## 2017-08-31 NOTE — L&D Delivery Note (Signed)
Vtx at +3 station and ROA.  Pt desires VE.  I discussed the R&B of VE including but not limited to injury to fetus but the potential benefit of expedited delivery.  She gives her informed consent and wishes to proceed. On the 5th pull, could not keep suction on due to caput, but was crowning, pt pushed and delivered viable female apgars 8,9 over 2nd degree ML lac   Placenta delivered spontaneously intact with 3VC. Repair with 2-0 Chromic with good support and hemostasis noted and R/V exam confirms.  PH art was snen .  [place] cord blood was not done.  Mother and baby were doing well.  EBL 350cc  Louretta Shorten, MD

## 2017-09-08 ENCOUNTER — Ambulatory Visit (INDEPENDENT_AMBULATORY_CARE_PROVIDER_SITE_OTHER): Payer: Commercial Managed Care - PPO | Admitting: Psychology

## 2017-09-08 DIAGNOSIS — F321 Major depressive disorder, single episode, moderate: Secondary | ICD-10-CM | POA: Diagnosis not present

## 2017-09-15 DIAGNOSIS — Z3685 Encounter for antenatal screening for Streptococcus B: Secondary | ICD-10-CM | POA: Diagnosis not present

## 2017-09-21 ENCOUNTER — Ambulatory Visit (INDEPENDENT_AMBULATORY_CARE_PROVIDER_SITE_OTHER): Payer: Commercial Managed Care - PPO | Admitting: Psychology

## 2017-09-21 DIAGNOSIS — F321 Major depressive disorder, single episode, moderate: Secondary | ICD-10-CM | POA: Diagnosis not present

## 2017-09-23 ENCOUNTER — Ambulatory Visit: Payer: Commercial Managed Care - PPO | Admitting: Psychology

## 2017-09-23 LAB — OB RESULTS CONSOLE GBS: STREP GROUP B AG: POSITIVE

## 2017-10-05 ENCOUNTER — Ambulatory Visit (INDEPENDENT_AMBULATORY_CARE_PROVIDER_SITE_OTHER): Payer: Commercial Managed Care - PPO | Admitting: Psychology

## 2017-10-05 DIAGNOSIS — F321 Major depressive disorder, single episode, moderate: Secondary | ICD-10-CM

## 2017-10-07 ENCOUNTER — Encounter (HOSPITAL_COMMUNITY): Payer: Self-pay | Admitting: *Deleted

## 2017-10-07 ENCOUNTER — Inpatient Hospital Stay (HOSPITAL_COMMUNITY)
Admission: AD | Admit: 2017-10-07 | Discharge: 2017-10-10 | DRG: 807 | Disposition: A | Discharge status: Home / Self Care | Payer: Commercial Managed Care - PPO | Source: Ambulatory Visit | Attending: Obstetrics and Gynecology | Admitting: Obstetrics and Gynecology

## 2017-10-07 ENCOUNTER — Other Ambulatory Visit: Payer: Self-pay

## 2017-10-07 DIAGNOSIS — O1493 Unspecified pre-eclampsia, third trimester: Secondary | ICD-10-CM

## 2017-10-07 DIAGNOSIS — R51 Headache: Secondary | ICD-10-CM | POA: Diagnosis not present

## 2017-10-07 DIAGNOSIS — Z8249 Family history of ischemic heart disease and other diseases of the circulatory system: Secondary | ICD-10-CM | POA: Diagnosis not present

## 2017-10-07 DIAGNOSIS — Z3A39 39 weeks gestation of pregnancy: Secondary | ICD-10-CM | POA: Diagnosis not present

## 2017-10-07 DIAGNOSIS — O134 Gestational [pregnancy-induced] hypertension without significant proteinuria, complicating childbirth: Principal | ICD-10-CM | POA: Diagnosis present

## 2017-10-07 DIAGNOSIS — Z3A40 40 weeks gestation of pregnancy: Secondary | ICD-10-CM | POA: Diagnosis not present

## 2017-10-07 DIAGNOSIS — E039 Hypothyroidism, unspecified: Secondary | ICD-10-CM | POA: Diagnosis present

## 2017-10-07 DIAGNOSIS — O99824 Streptococcus B carrier state complicating childbirth: Secondary | ICD-10-CM | POA: Diagnosis present

## 2017-10-07 DIAGNOSIS — O99284 Endocrine, nutritional and metabolic diseases complicating childbirth: Secondary | ICD-10-CM | POA: Diagnosis present

## 2017-10-07 DIAGNOSIS — O139 Gestational [pregnancy-induced] hypertension without significant proteinuria, unspecified trimester: Secondary | ICD-10-CM | POA: Diagnosis present

## 2017-10-07 DIAGNOSIS — Q620 Congenital hydronephrosis: Secondary | ICD-10-CM | POA: Diagnosis not present

## 2017-10-07 DIAGNOSIS — Z23 Encounter for immunization: Secondary | ICD-10-CM | POA: Diagnosis not present

## 2017-10-07 HISTORY — DX: Unspecified pre-eclampsia, third trimester: O14.93

## 2017-10-07 HISTORY — DX: Gestational (pregnancy-induced) hypertension without significant proteinuria, unspecified trimester: O13.9

## 2017-10-07 LAB — PROTEIN / CREATININE RATIO, URINE
CREATININE, URINE: 237 mg/dL
Protein Creatinine Ratio: 0.3 mg/mg{Cre} — ABNORMAL HIGH (ref 0.00–0.15)
Total Protein, Urine: 72 mg/dL

## 2017-10-07 LAB — CBC
HEMATOCRIT: 35.6 % — AB (ref 36.0–46.0)
HEMOGLOBIN: 12.2 g/dL (ref 12.0–15.0)
MCH: 29.8 pg (ref 26.0–34.0)
MCHC: 34.3 g/dL (ref 30.0–36.0)
MCV: 86.8 fL (ref 78.0–100.0)
PLATELETS: 211 10*3/uL (ref 150–400)
RBC: 4.1 MIL/uL (ref 3.87–5.11)
RDW: 14.1 % (ref 11.5–15.5)
WBC: 10.5 10*3/uL (ref 4.0–10.5)

## 2017-10-07 LAB — COMPREHENSIVE METABOLIC PANEL
ALT: 13 U/L — AB (ref 14–54)
AST: 21 U/L (ref 15–41)
Albumin: 2.6 g/dL — ABNORMAL LOW (ref 3.5–5.0)
Alkaline Phosphatase: 151 U/L — ABNORMAL HIGH (ref 38–126)
Anion gap: 8 (ref 5–15)
BUN: 10 mg/dL (ref 6–20)
CHLORIDE: 109 mmol/L (ref 101–111)
CO2: 19 mmol/L — ABNORMAL LOW (ref 22–32)
CREATININE: 0.86 mg/dL (ref 0.44–1.00)
Calcium: 8.5 mg/dL — ABNORMAL LOW (ref 8.9–10.3)
GFR calc Af Amer: >60 mL/min (ref >60)
Glucose, Bld: 77 mg/dL (ref 65–99)
Potassium: 3.8 mmol/L (ref 3.5–5.1)
Sodium: 136 mmol/L (ref 135–145)
Total Bilirubin: 0.6 mg/dL (ref 0.3–1.2)
Total Protein: 5.9 g/dL — ABNORMAL LOW (ref 6.5–8.1)

## 2017-10-07 LAB — ABO/RH: ABO/RH(D): O POS

## 2017-10-07 LAB — TYPE AND SCREEN
ABO/RH(D): O POS
Antibody Screen: NEGATIVE

## 2017-10-07 MED ORDER — OXYTOCIN 40 UNITS IN LACTATED RINGERS INFUSION - SIMPLE MED: 2.5000 [IU]/h | INTRAVENOUS | Status: DC

## 2017-10-07 MED ORDER — SOD CITRATE-CITRIC ACID 500-334 MG/5ML PO SOLN
30.0000 mL | ORAL | Status: DC | PRN
  Administered 2017-10-07 – 2017-10-08 (×2): 30 mL via ORAL

## 2017-10-07 MED ORDER — PENICILLIN G POTASSIUM 5000000 UNITS IJ SOLR
5.0000 10*6.[IU] | Freq: Once | INTRAVENOUS | Status: AC
  Administered 2017-10-07: 5 10*6.[IU] via INTRAVENOUS
  Filled 2017-10-07: qty 5

## 2017-10-07 MED ORDER — OXYTOCIN BOLUS FROM INFUSION: 500.0000 mL | Freq: Once | INTRAVENOUS | Status: DC

## 2017-10-07 MED ORDER — LACTATED RINGERS IV SOLN
INTRAVENOUS | Status: DC
  Administered 2017-10-07 – 2017-10-08 (×3): via INTRAVENOUS

## 2017-10-07 MED ORDER — OXYCODONE-ACETAMINOPHEN 5-325 MG PO TABS: 2.0000 | ORAL_TABLET | ORAL | Status: DC | PRN

## 2017-10-07 MED ORDER — OXYTOCIN BOLUS FROM INFUSION
500.0000 mL | Freq: Once | INTRAVENOUS | Status: AC
  Administered 2017-10-08: 500 mL via INTRAVENOUS

## 2017-10-07 MED ORDER — ONDANSETRON HCL 4 MG/2ML IJ SOLN
4.0000 mg | Freq: Four times a day (QID) | INTRAMUSCULAR | Status: DC | PRN
  Filled 2017-10-07: qty 2

## 2017-10-07 MED ORDER — OXYTOCIN 40 UNITS IN LACTATED RINGERS INFUSION - SIMPLE MED
2.5000 [IU]/h | INTRAVENOUS | Status: DC
  Administered 2017-10-08: 2.5 [IU]/h via INTRAVENOUS

## 2017-10-07 MED ORDER — LACTATED RINGERS IV SOLN: 500.0000 mL | INTRAVENOUS | Status: DC | PRN

## 2017-10-07 MED ORDER — PENICILLIN G POT IN DEXTROSE 60000 UNIT/ML IV SOLN
3.0000 10*6.[IU] | INTRAVENOUS | Status: DC
  Administered 2017-10-07 – 2017-10-08 (×8): 3 10*6.[IU] via INTRAVENOUS
  Filled 2017-10-07 (×15): qty 50

## 2017-10-07 MED ORDER — ONDANSETRON HCL 4 MG/2ML IJ SOLN
4.0000 mg | Freq: Four times a day (QID) | INTRAMUSCULAR | Status: DC | PRN
  Administered 2017-10-08: 4 mg via INTRAVENOUS

## 2017-10-07 MED ORDER — OXYCODONE-ACETAMINOPHEN 5-325 MG PO TABS: 1.0000 | ORAL_TABLET | ORAL | Status: DC | PRN

## 2017-10-07 MED ORDER — OXYTOCIN 40 UNITS IN LACTATED RINGERS INFUSION - SIMPLE MED
1.0000 m[IU]/min | INTRAVENOUS | Status: DC
  Administered 2017-10-07: 2 m[IU]/min via INTRAVENOUS
  Filled 2017-10-07 (×2): qty 1000

## 2017-10-07 MED ORDER — SOD CITRATE-CITRIC ACID 500-334 MG/5ML PO SOLN
30.0000 mL | ORAL | Status: DC | PRN
  Filled 2017-10-07 (×2): qty 15

## 2017-10-07 MED ORDER — LACTATED RINGERS IV SOLN
INTRAVENOUS | Status: DC
  Administered 2017-10-07: 23:00:00 via INTRAVENOUS

## 2017-10-07 MED ORDER — BUTORPHANOL TARTRATE 1 MG/ML IJ SOLN: 1.0000 mg | INTRAMUSCULAR | Status: DC | PRN

## 2017-10-07 MED ORDER — FLEET ENEMA 7-19 GM/118ML RE ENEM: 1.0000 | ENEMA | RECTAL | Status: DC | PRN

## 2017-10-07 MED ORDER — ACETAMINOPHEN 325 MG PO TABS: 650.0000 mg | ORAL_TABLET | ORAL | Status: DC | PRN

## 2017-10-07 MED ORDER — TERBUTALINE SULFATE 1 MG/ML IJ SOLN
0.2500 mg | Freq: Once | INTRAMUSCULAR | Status: DC | PRN
  Filled 2017-10-07: qty 1

## 2017-10-07 MED ORDER — ACETAMINOPHEN 325 MG PO TABS
650.0000 mg | ORAL_TABLET | ORAL | Status: DC | PRN
  Administered 2017-10-07: 650 mg via ORAL
  Filled 2017-10-07: qty 2

## 2017-10-07 NOTE — MAU Provider Note (Signed)
History     CSN: [id number]  Arrival date and time: 10/07/17 1111   First Provider Initiated Contact with Patient 10/07/17 1211      Chief Complaint  Patient presents with  . Hypertension   HPI Holly Singleton is a 32 y.o. G1P0 at [redacted]w[redacted]d who presents from the office for BP evaluation. Saw Dr. Gaetano Net this morning for [place] visit. Had slightly elevated BP; states she had elevated BP once prior, 3 weeks ago. Wok up this morning with a bad headache that resolved without intervention. Denies visual disturbance or epigastric pain. Has history of hypertension 5+ years ago that resolved after losing 80 lbs. Was not treated for her hypertension and states it was not consistently high. Dr. Gaetano Net check her cervix this morning, 3/70%/vertex.  Positive fetal movement.   OB History    Gravida Para Term Preterm AB Living   1             SAB TAB Ectopic Multiple Live Births                  Past Medical History:  Diagnosis Date  . Hypothyroidism   . Morbid obesity ([place])     Past Surgical History:  Procedure Laterality Date  . APPENDECTOMY      Family History  Problem Relation Age of Onset  . Hypertension Mother   . Cancer Mother 97       lung CA  . Hypertension Father     Social History   Tobacco Use  . Smoking status: Never Smoker  . Smokeless tobacco: Never Used  Substance Use Topics  . Alcohol use: Yes    Comment: occasionally  . Drug use: No    Allergies:  Allergies  Allergen Reactions  . Lidocaine     Medications Prior to Admission  Medication Sig Dispense Refill Last Dose  . levothyroxine (SYNTHROID, LEVOTHROID) 112 MCG tablet Take 1 tablet (112 mcg total) by mouth daily. 90 tablet 3     Review of Systems  Constitutional: Negative.   Eyes: Negative for visual disturbance.  Gastrointestinal: Negative.   Neurological: Positive for headaches.   Physical Exam   Blood pressure 136/81, pulse 81, temperature 98.7 F (37.1 C), resp. rate 18, height 5' 8.5"  (1.74 m), weight 256 lb (116.1 kg), last menstrual period 01/01/2017. Patient Vitals for the past 24 hrs:  BP Temp Pulse Resp Height Weight  10/07/17 1216 (!) 135/58 - 84 - - -  10/07/17 1201 132/78 - 78 - - -  10/07/17 1146 136/81 - 81 - - -  10/07/17 1135 (!) 134/94 - 81 - - -  10/07/17 1119 (!) 136/93 98.7 F (37.1 C) 91 18 5' 8.5" (1.74 m) 256 lb (116.1 kg)    Physical Exam  Nursing note and vitals reviewed. Constitutional: She is oriented to person, place, and time. She appears well-developed and well-nourished. No distress.  HENT:  Head: Normocephalic and atraumatic.  Eyes: Conjunctivae are normal. Right eye exhibits no discharge. Left eye exhibits no discharge. No scleral icterus.  Neck: Normal range of motion.  Cardiovascular: Normal rate, regular rhythm and normal heart sounds.  No murmur heard. Respiratory: Effort normal and breath sounds normal. No respiratory distress. She has no wheezes.  GI: Soft. There is no tenderness.  Musculoskeletal: She exhibits edema (BLE, 2+ pitting).  Neurological: She is alert and oriented to person, place, and time. She has normal reflexes.  No clonus  Skin: Skin is warm and dry. She is  not diaphoretic.  Psychiatric: She has a normal mood and affect. Her behavior is normal. Judgment and thought content normal.    MAU Course  Procedures Results for orders placed or performed during the hospital encounter of 10/07/17 (from the past 24 hour(s))  Protein / creatinine ratio, urine     Status: Abnormal   Collection Time: 10/07/17 11:15 AM  Result Value Ref Range   Creatinine, Urine 237.00 mg/dL   Total Protein, Urine 72 mg/dL   Protein Creatinine Ratio 0.30 (H) 0.00 - 0.15 mg/mg[Cre]  CBC     Status: Abnormal   Collection Time: 10/07/17 11:58 AM  Result Value Ref Range   WBC 10.5 4.0 - 10.5 K/uL   RBC 4.10 3.87 - 5.11 MIL/uL   Hemoglobin 12.2 12.0 - 15.0 g/dL   HCT 35.6 (L) 36.0 - 46.0 %   MCV 86.8 78.0 - 100.0 fL   MCH 29.8 26.0 -  34.0 pg   MCHC 34.3 30.0 - 36.0 g/dL   RDW 14.1 11.5 - 15.5 %   Platelets 211 150 - 400 K/uL  Comprehensive metabolic panel     Status: Abnormal   Collection Time: 10/07/17 11:58 AM  Result Value Ref Range   Sodium 136 135 - 145 mmol/L   Potassium 3.8 3.5 - 5.1 mmol/L   Chloride 109 101 - 111 mmol/L   CO2 19 (L) 22 - 32 mmol/L   Glucose, Bld 77 65 - 99 mg/dL   BUN 10 6 - 20 mg/dL   Creatinine, Ser 0.86 0.44 - 1.00 mg/dL   Calcium 8.5 (L) 8.9 - 10.3 mg/dL   Total Protein 5.9 (L) 6.5 - 8.1 g/dL   Albumin 2.6 (L) 3.5 - 5.0 g/dL   AST 21 15 - 41 U/L   ALT 13 (L) 14 - 54 U/L   Alkaline Phosphatase 151 (H) 38 - 126 U/L   Total Bilirubin 0.6 0.3 - 1.2 mg/dL   GFR calc non Af Amer >60 >60 mL/min   GFR calc Af Amer >60 >60 mL/min   Anion gap 8 5 - 15   MDM NST:  Baseline: 125 bpm, Variability: Good {> 6 bpm), Accelerations: Reactive and Decelerations: Absent Elevated BP -- none severe range CBC, CMP, urine PCR PCR = 0.30 C/w Dr. Julien Girt. Will admit to birthing suites.  Assessment and Plan  A: 1. Pre-eclampsia in third trimester   2. [redacted] weeks gestation of pregnancy    P: Admission orders placed Penicillin IV for GBS + L&D nurse to call Dr. Julien Girt when patient on unit   Jorje Guild 10/07/2017, 12:11 PM

## 2017-10-07 NOTE — Anesthesia Pain Management Evaluation Note (Signed)
  CRNA Pain Management Visit Note  Patient: Holly Singleton, 32 y.o., female  "Hello I am a member of the anesthesia team at [facility]. We have an anesthesia team available at all times to provide care throughout the hospital, including epidural management and anesthesia for C-section. I don't know your plan for the delivery whether it a natural birth, water birth, IV sedation, nitrous supplementation, doula or epidural, but we want to meet your pain goals."   1.Was your pain managed to your expectations on prior hospitalizations?   No prior hospitalizations  2.What is your expectation for pain management during this hospitalization?     IV pain meds  3.How can we help you reach that goal?   Record the patient's initial score and the patient's pain goal.   Pain: 2  Pain Goal: 8 [facility] wants you to be able to say your pain was always managed very well.  Jabier Mutton 10/07/2017

## 2017-10-07 NOTE — MAU Note (Signed)
Urine in lab 

## 2017-10-07 NOTE — H&P (Signed)
Holly Singleton is a 32 y.o. female presenting for IOL.  Pt seen in office today w/ complaints of HA and RUQ pain.  HA resolved w/out treatment.  BP in MAU elevated 130s/90s.  No ctx, vb or lof.  OB History    Gravida Para Term Preterm AB Living   1 0 0 0 0 0   SAB TAB Ectopic Multiple Live Births   0 0 0 0 0     Past Medical History:  Diagnosis Date  . Hypothyroidism   . Morbid obesity ([place])    Past Surgical History:  Procedure Laterality Date  . APPENDECTOMY     Family History: family history includes Cancer (age of onset: 81) in her mother; Hypertension in her father and mother. Social History:  reports that  has never smoked. she has never used smokeless tobacco. She reports that she drinks alcohol. She reports that she does not use drugs.     Maternal Diabetes: No Genetic Screening: Normal Maternal Ultrasounds/Referrals: Normal Fetal Ultrasounds or other Referrals:  None Maternal Substance Abuse:  No Significant Maternal Medications:  None Significant Maternal Lab Results:  None Other Comments:  None  ROS History Dilation: 3 Effacement (%): 70 Station: -2 Exam by:: Holly Hill RN Blood pressure (!) 138/91, pulse 90, temperature 98 F (36.7 C), temperature source Oral, resp. rate 14, height 5' 8.5" (1.74 m), weight 256 lb (116.1 kg), last menstrual period 01/01/2017. Exam Physical Exam  Prenatal labs: ABO, Rh: --/--/O POS (02/07 1411) Antibody: NEG (02/07 1411) Rubella: Immune (07/05 0000) RPR: Nonreactive (07/05 0000)  HBsAg: Negative (07/05 0000)  HIV: Non-reactive (07/05 0000)  GBS: Positive (01/24 0000)   Assessment/Plan: Admit Pitocin IOL PCN for GBS + Epidural prn   Marylynn Pearson 10/07/2017, 7:58 PM

## 2017-10-07 NOTE — MAU Note (Signed)
Pt reports she was sent from office for headache and weight gain 5lb since last week. B/"not bad" according to patient but told to come in due to above symptoms.

## 2017-10-08 ENCOUNTER — Inpatient Hospital Stay (HOSPITAL_COMMUNITY): Payer: Commercial Managed Care - PPO | Admitting: Anesthesiology

## 2017-10-08 ENCOUNTER — Encounter (HOSPITAL_COMMUNITY): Payer: Self-pay

## 2017-10-08 LAB — CBC
HCT: 35.8 % — ABNORMAL LOW (ref 36.0–46.0)
HEMATOCRIT: 35 % — AB (ref 36.0–46.0)
HEMOGLOBIN: 12.3 g/dL (ref 12.0–15.0)
Hemoglobin: 12.2 g/dL (ref 12.0–15.0)
MCH: 30.2 pg (ref 26.0–34.0)
MCH: 29.7 pg (ref 26.0–34.0)
MCHC: 34.9 g/dL (ref 30.0–36.0)
MCHC: 34.4 g/dL (ref 30.0–36.0)
MCV: 86.6 fL (ref 78.0–100.0)
MCV: 86.5 fL (ref 78.0–100.0)
Platelets: 203 10*3/uL (ref 150–400)
Platelets: 200 10*3/uL (ref 150–400)
RBC: 4.04 MIL/uL (ref 3.87–5.11)
RBC: 4.14 MIL/uL (ref 3.87–5.11)
RDW: 14 % (ref 11.5–15.5)
RDW: 13.8 % (ref 11.5–15.5)
WBC: 13.3 10*3/uL — ABNORMAL HIGH (ref 4.0–10.5)
WBC: 17.2 10*3/uL — AB (ref 4.0–10.5)

## 2017-10-08 LAB — RPR: RPR: NONREACTIVE

## 2017-10-08 MED ORDER — LACTATED RINGERS IV SOLN: 500.0000 mL | Freq: Once | INTRAVENOUS | Status: DC

## 2017-10-08 MED ORDER — FENTANYL 2.5 MCG/ML BUPIVACAINE 1/10 % EPIDURAL INFUSION (WH - ANES)
14.0000 mL/h | INTRAMUSCULAR | Status: DC | PRN
  Administered 2017-10-08 (×2): 14 mL/h via EPIDURAL
  Filled 2017-10-08 (×2): qty 100

## 2017-10-08 MED ORDER — PHENYLEPHRINE 40 MCG/ML (10ML) SYRINGE FOR IV PUSH (FOR BLOOD PRESSURE SUPPORT)
80.0000 ug | PREFILLED_SYRINGE | INTRAVENOUS | Status: DC | PRN
  Filled 2017-10-08: qty 5
  Filled 2017-10-08: qty 10

## 2017-10-08 MED ORDER — EPHEDRINE 5 MG/ML INJ
10.0000 mg | INTRAVENOUS | Status: DC | PRN
  Filled 2017-10-08: qty 2

## 2017-10-08 MED ORDER — LIDOCAINE HCL (PF) 1 % IJ SOLN
INTRAMUSCULAR | Status: AC
  Filled 2017-10-08: qty 30

## 2017-10-08 MED ORDER — PHENYLEPHRINE 40 MCG/ML (10ML) SYRINGE FOR IV PUSH (FOR BLOOD PRESSURE SUPPORT)
80.0000 ug | PREFILLED_SYRINGE | INTRAVENOUS | Status: DC | PRN
  Filled 2017-10-08: qty 5

## 2017-10-08 MED ORDER — IBUPROFEN 600 MG PO TABS
600.0000 mg | ORAL_TABLET | Freq: Four times a day (QID) | ORAL | Status: DC
  Administered 2017-10-08 – 2017-10-10 (×6): 600 mg via ORAL
  Filled 2017-10-08 (×6): qty 1

## 2017-10-08 MED ORDER — LIDOCAINE HCL (PF) 1 % IJ SOLN
INTRAMUSCULAR | Status: DC | PRN
  Administered 2017-10-08: 5 mL
  Administered 2017-10-08: 3 mL
  Administered 2017-10-08: 2 mL

## 2017-10-08 MED ORDER — DIPHENHYDRAMINE HCL 50 MG/ML IJ SOLN: 12.5000 mg | INTRAMUSCULAR | Status: DC | PRN

## 2017-10-08 NOTE — Anesthesia Procedure Notes (Signed)
Epidural Patient location during procedure: OB Start time: 10/08/2017 11:17 AM End time: 10/08/2017 11:25 AM  Staffing Anesthesiologist: Suzette Battiest, MD Performed: anesthesiologist   Preanesthetic Checklist Completed: patient identified, site marked, surgical consent, pre-op evaluation, timeout performed, IV checked, risks and benefits discussed and monitors and equipment checked  Epidural Patient position: sitting Prep: site prepped and draped and DuraPrep Patient monitoring: continuous pulse ox and blood pressure Approach: midline Location: L4-L5 Injection technique: LOR air  Needle:  Needle type: Tuohy  Needle gauge: 17 G Needle length: 9 cm and 9 Needle insertion depth: 7 cm Catheter type: closed end flexible Catheter size: 19 Gauge Catheter at skin depth: 15.5 cm Test dose: negative  Assessment Events: blood not aspirated, injection not painful, no injection resistance, negative IV test and no paresthesia

## 2017-10-08 NOTE — Progress Notes (Signed)
Patient ID: Holly Singleton, female   DOB: 1986-04-23, 32 y.o.   MRN: [id number] Pt without complaints GFM Ctxs mild No severe sxs  VSSAF FHR 140s Ctxs q 3-5  Cx 3/70/-3  AROM clear DTRS 2/4  Gest HTN - mild Cont IOL Anticipate SVD

## 2017-10-08 NOTE — Anesthesia Preprocedure Evaluation (Signed)
Anesthesia Evaluation  Patient identified by MRN, date of birth, ID band Patient awake    Reviewed: Allergy & Precautions, Patient's Chart, lab work & pertinent test results  Airway Mallampati: II  TM Distance: >3 FB     Dental   Pulmonary neg pulmonary ROS,    breath sounds clear to auscultation       Cardiovascular hypertension (PIH),  Rhythm:Regular Rate:Normal     Neuro/Psych negative neurological ROS     GI/Hepatic negative GI ROS, Neg liver ROS,   Endo/Other  Hypothyroidism   Renal/GU negative Renal ROS     Musculoskeletal   Abdominal   Peds  Hematology negative hematology ROS (+)   Anesthesia Other Findings   Reproductive/Obstetrics (+) Pregnancy                             Lab Results  Component Value Date   WBC 13.3 (H) 10/08/2017   HGB 12.2 10/08/2017   HCT 35.0 (L) 10/08/2017   MCV 86.6 10/08/2017   PLT 203 10/08/2017   Lab Results  Component Value Date   CREATININE 0.86 10/07/2017   BUN 10 10/07/2017   NA 136 10/07/2017   K 3.8 10/07/2017   CL 109 10/07/2017   CO2 19 (L) 10/07/2017    Anesthesia Physical Anesthesia Plan  ASA: II  Anesthesia Plan: Epidural   Post-op Pain Management:    Induction:   PONV Risk Score and Plan: 2 and Treatment may vary due to age or medical condition  Airway Management Planned: Natural Airway  Additional Equipment:   Intra-op Plan:   Post-operative Plan:   Informed Consent: I have reviewed the patients History and Physical, chart, labs and discussed the procedure including the risks, benefits and alternatives for the proposed anesthesia with the patient or authorized representative who has indicated his/her understanding and acceptance.     Plan Discussed with:   Anesthesia Plan Comments:         Anesthesia Quick Evaluation

## 2017-10-09 ENCOUNTER — Encounter (HOSPITAL_COMMUNITY): Payer: Self-pay | Admitting: *Deleted

## 2017-10-09 LAB — CBC
HCT: 33.9 % — ABNORMAL LOW (ref 36.0–46.0)
HEMOGLOBIN: 11.8 g/dL — AB (ref 12.0–15.0)
MCH: 30 pg (ref 26.0–34.0)
MCHC: 34.8 g/dL (ref 30.0–36.0)
MCV: 86.3 fL (ref 78.0–100.0)
PLATELETS: 221 10*3/uL (ref 150–400)
RBC: 3.93 MIL/uL (ref 3.87–5.11)
RDW: 14 % (ref 11.5–15.5)
WBC: 15.1 10*3/uL — ABNORMAL HIGH (ref 4.0–10.5)

## 2017-10-09 MED ORDER — COCONUT OIL OIL
1.0000 "application " | TOPICAL_OIL | Status: DC | PRN
  Administered 2017-10-10: 1 via TOPICAL
  Filled 2017-10-09: qty 120

## 2017-10-09 MED ORDER — ONDANSETRON HCL 4 MG PO TABS: 4.0000 mg | ORAL_TABLET | ORAL | Status: DC | PRN

## 2017-10-09 MED ORDER — BENZOCAINE-MENTHOL 20-0.5 % EX AERO
1.0000 "application " | INHALATION_SPRAY | CUTANEOUS | Status: DC | PRN
  Filled 2017-10-09: qty 56

## 2017-10-09 MED ORDER — MEDROXYPROGESTERONE ACETATE 150 MG/ML IM SUSP: 150.0000 mg | INTRAMUSCULAR | Status: DC | PRN

## 2017-10-09 MED ORDER — ACETAMINOPHEN 325 MG PO TABS: 650.0000 mg | ORAL_TABLET | ORAL | Status: DC | PRN

## 2017-10-09 MED ORDER — OXYMETAZOLINE HCL 0.05 % NA SOLN
1.0000 | Freq: Two times a day (BID) | NASAL | Status: DC | PRN
  Administered 2017-10-09: 1 via NASAL
  Filled 2017-10-09: qty 15

## 2017-10-09 MED ORDER — OXYCODONE-ACETAMINOPHEN 5-325 MG PO TABS: 2.0000 | ORAL_TABLET | ORAL | Status: DC | PRN

## 2017-10-09 MED ORDER — ONDANSETRON HCL 4 MG/2ML IJ SOLN: 4.0000 mg | INTRAMUSCULAR | Status: DC | PRN

## 2017-10-09 MED ORDER — LEVOTHYROXINE SODIUM 112 MCG PO TABS
112.0000 ug | ORAL_TABLET | Freq: Every day | ORAL | Status: DC
  Administered 2017-10-09 – 2017-10-10 (×2): 112 ug via ORAL
  Filled 2017-10-09 (×2): qty 1

## 2017-10-09 MED ORDER — DIPHENHYDRAMINE HCL 25 MG PO CAPS: 25.0000 mg | ORAL_CAPSULE | Freq: Four times a day (QID) | ORAL | Status: DC | PRN

## 2017-10-09 MED ORDER — SIMETHICONE 80 MG PO CHEW: 80.0000 mg | CHEWABLE_TABLET | ORAL | Status: DC | PRN

## 2017-10-09 MED ORDER — SENNOSIDES-DOCUSATE SODIUM 8.6-50 MG PO TABS
2.0000 | ORAL_TABLET | ORAL | Status: DC
  Administered 2017-10-09 (×2): 2 via ORAL
  Filled 2017-10-09 (×2): qty 2

## 2017-10-09 MED ORDER — ZOLPIDEM TARTRATE 5 MG PO TABS: 5.0000 mg | ORAL_TABLET | Freq: Every evening | ORAL | Status: DC | PRN

## 2017-10-09 MED ORDER — PRENATAL MULTIVITAMIN CH
1.0000 | ORAL_TABLET | Freq: Every day | ORAL | Status: DC
  Administered 2017-10-09: 1 via ORAL
  Filled 2017-10-09: qty 1

## 2017-10-09 MED ORDER — OXYCODONE-ACETAMINOPHEN 5-325 MG PO TABS: 1.0000 | ORAL_TABLET | ORAL | Status: DC | PRN

## 2017-10-09 MED ORDER — WITCH HAZEL-GLYCERIN EX PADS: 1.0000 "application " | MEDICATED_PAD | CUTANEOUS | Status: DC | PRN

## 2017-10-09 MED ORDER — TETANUS-DIPHTH-ACELL PERTUSSIS 5-2.5-18.5 LF-MCG/0.5 IM SUSP: 0.5000 mL | Freq: Once | INTRAMUSCULAR | Status: DC

## 2017-10-09 MED ORDER — MEASLES, MUMPS & RUBELLA VAC ~~LOC~~ INJ
0.5000 mL | INJECTION | Freq: Once | SUBCUTANEOUS | Status: DC
  Filled 2017-10-09: qty 0.5

## 2017-10-09 MED ORDER — DIBUCAINE 1 % RE OINT: 1.0000 "application " | TOPICAL_OINTMENT | RECTAL | Status: DC | PRN

## 2017-10-09 NOTE — Clinical Social Work Note (Signed)
.  MOB was referred for history of situational depression.    Referral is screened out by Clinical Social Worker because none of the following criteria appear to apply:   -History of anxiety/depression during this pregnancy, or of post-partum depression. - Diagnosis of anxiety and/or depression within last 3 years - History of depression due to pregnancy loss/loss of child - No reports impacting the pregnancy or her transition to the postpartum period.  CSW does not deem it clinically necessary to further investigate at this time.  Please contact the Clinical Social Worker if needs arise or upon MOB request.

## 2017-10-09 NOTE — Anesthesia Postprocedure Evaluation (Signed)
Anesthesia Post Note  Patient: Holly Singleton  Procedure(s) Performed: AN AD [place]     Patient location during evaluation: Mother Baby Anesthesia Type: Epidural Level of consciousness: awake and alert Pain management: pain level controlled Vital Signs Assessment: post-procedure vital signs reviewed and stable Respiratory status: spontaneous breathing Cardiovascular status: blood pressure returned to baseline Postop Assessment: no headache, patient able to bend at knees, no backache, no apparent nausea or vomiting, epidural receding and adequate PO intake Anesthetic complications: no    Last Vitals:  Vitals:   10/09/17 0115 10/09/17 0500  BP: 124/72 122/60  Pulse: 88 82  Resp: 18 16  Temp: 36.8 C 36.5 C  SpO2:      Last Pain:  Vitals:   10/09/17 0507  TempSrc:   PainSc: 4    Pain Goal: Patients Stated Pain Goal: 0 (10/07/17 2331)               Ailene Ards

## 2017-10-09 NOTE — Progress Notes (Signed)
RN in room to assist pt with getting out of bed and going to bathroom. When getting up, pt stated L leg felt unsteady. Steady device used to transfer pt to restroom. Pt was able to walk back to bed with stand by assist of RN. Instructed pt to call for assistance when getting out of bed again.

## 2017-10-09 NOTE — Progress Notes (Signed)
Post Partum Day 1 Subjective: no complaints, up ad lib, voiding and tolerating PO  Objective: Blood pressure 122/60, pulse 82, temperature 97.7 F (36.5 C), temperature source Oral, resp. rate 16, height 5' 8.5" (1.74 m), weight 256 lb (116.1 kg), last menstrual period 01/01/2017, SpO2 97 %, unknown if currently breastfeeding.  Physical Exam:  General: alert, cooperative, appears stated age and no distress Lochia: appropriate Uterine Fundus: firm Incision: healing well DVT Evaluation: No evidence of DVT seen on physical exam.  Recent Labs    10/08/17 2325 10/09/17 0519  HGB 12.3 11.8*  HCT 35.8* 33.9*    Assessment/Plan: Plan for discharge tomorrow, Breastfeeding and Circumcision prior to discharge   LOS: 2 days   Holly Singleton C 10/09/2017, 9:47 AM

## 2017-10-09 NOTE — Lactation Note (Signed)
This note was copied from a baby's chart. Lactation Consultation Note; Initial visit- mom reports he has been sleepy at the breast and only takes a few sucks. NP examining baby as I went into room. Assisted with latch in football hold. Baby nursed for 20 min- mom reports this is the best he has done. Encouraged to keep him close to the breast throughout the feeding. Reviewed feeding cues and encouraged to feed whenever she sees them, Reviewed normal new born behavior and cluster feeding. BF brochure given, Reviewed our phone number, OP appointments and BFSG as resources for support after DC. To call for assist prn  Patient Name: Holly Singleton [id number] Date: 10/09/2017 Reason for consult: Initial assessment   Maternal Data Formula Feeding for Exclusion: No Has patient been taught Hand Expression?: Yes Does the patient have breastfeeding experience prior to this delivery?: No  Feeding Feeding Type: Breast Fed Length of feed: 20 min  LATCH Score Latch: Grasps breast easily, tongue down, lips flanged, rhythmical sucking.  Audible Swallowing: A few with stimulation  Type of Nipple: Everted at rest and after stimulation  Comfort (Breast/Nipple): Soft / non-tender  Hold (Positioning): Assistance needed to correctly position infant at breast and maintain latch.  LATCH Score: 8  Interventions Interventions: Breast feeding basics reviewed;Assisted with latch;Hand express;Breast compression;Support pillows  Lactation Tools Discussed/Used     Consult Status Consult Status: Follow-up Date: 10/10/17 Follow-up type: In-patient    Truddie Crumble 10/09/2017, 10:21 AM

## 2017-10-10 NOTE — Lactation Note (Signed)
This note was copied from a baby's chart. Lactation Consultation Note  Patient Name: Holly Singleton Date: 10/10/2017 Reason for consult: Follow-up assessment;Primapara;1st time breastfeeding;Infant weight loss Baby is  84 hours old, mom mentioned she feels really tired today.  LC reviewed and updated the doc flow sheets  Baby awake as LC entered the room and per  Mom  Has not had a long feeding in a while. I'm feeling unsure if the  Baby is latching well today compared to yesterday.  Combs 1st reviewed basics - hand expressing, tiny drops noted from both nipples.  LC noted wide space between breast and under developed breast tissue, erect nipples  And compressible areolas. Per mom the areola got darker, breast changed in size at 1st  And then decrease alittle in size. Both breast were sensitive 1st trimester.  With firm support latched the baby on the right breast / football/ with depth.  Sluggish at 1st and [facility] assisted with mom using a hand pump on the left breast,  And increase swallows noted. Baby fed  20 mins, nipple well rounded when baby  Released. LC assisted to latch on the left breast / football/ few swallows noted and  Still feeding at 7 mins.  LC discussed nutritive vs non - nutritive feeding patterns and the importance of watching  For hanging out latched.  Mom denies soreness , sore and engorgement prevention and tx reviewed.  LC instructed mom on the use of hand pump.  Per mom has a DEBP Spectra at home.   Due to moms underdeveloped breast ( [place] suspects insufficient glandular tissue )    LC recommended F/U with LC O/P appt. Mom receptive to coming back for [facility] O/P  This week Thursday or Friday and is aware the clinic will call her.  LC also recommended adding - prepumping with hand pump after hand expressing and   post pumping both breast after 4 feedings a dat for 10 -15 mins  To enhance the milk coming in and spoon feed back to baby.  Mother informed of  post-discharge support and given phone number to the lactation department, including services for phone call assistance; out-patient appointments; and breastfeeding support group. List of other breastfeeding resources in the community given in the handout. Encouraged mother to call for problems or concerns related to breastfeeding.     Maternal Data Has patient been taught Hand Expression?: Yes  Feeding Feeding Type: Breast Fed(switched to the left breast / football ) Length of feed: (swallows noted, still feeding at 7 mins )  LATCH Score Latch: Grasps breast easily, tongue down, lips flanged, rhythmical sucking.  Audible Swallowing: A few with stimulation  Type of Nipple: Everted at rest and after stimulation  Comfort (Breast/Nipple): Soft / non-tender  Hold (Positioning): Assistance needed to correctly position infant at breast and maintain latch.  LATCH Score: 8  Interventions Interventions: Breast feeding basics reviewed;Assisted with latch;Skin to skin;Breast massage;Hand express;Breast compression;Adjust position;Support pillows;Position options;Hand pump  Lactation Tools Discussed/Used Tools: Pump Breast pump type: Manual WIC Program: No Pump Review: Setup, frequency, and cleaning;Milk Storage Initiated by:: MAI  Date initiated:: 10/10/17   Consult Status Consult Status: Follow-up Date: (mom recptive to coming back for [facility] O/P appt Thurs. or Friday of this week , LC placed request in the basket ) Follow-up type: Out-patient(at [facility] clinic O/P )    [place] 10/10/2017, 11:30 AM

## 2017-10-10 NOTE — Discharge Summary (Signed)
Obstetric Discharge Summary Reason for Admission: induction of labor Prenatal Procedures: none Intrapartum Procedures: VE Postpartum Procedures: none Complications-Operative and Postpartum: 2 degree perineal laceration Hemoglobin  Date Value Ref Range Status  10/09/2017 11.8 (L) 12.0 - 15.0 g/dL Final   HGB  Date Value Ref Range Status  08/27/2012 14.8 12.0 - 16.0 g/dL Final   HCT  Date Value Ref Range Status  10/09/2017 33.9 (L) 36.0 - 46.0 % Final  08/27/2012 43.2 35.0 - 47.0 % Final    Physical Exam:  General: alert, cooperative, appears stated age and no distress Lochia: appropriate Uterine Fundus: firm Incision: no significant drainage DVT Evaluation: No evidence of DVT seen on physical exam.  Discharge Diagnoses: Term Pregnancy-delivered  Discharge Information: Date: 10/10/2017 Activity: pelvic rest Diet: routine Medications: None Condition: stable Instructions: refer to practice specific booklet Discharge to: home   Newborn Data: Live born female  Birth Weight: 8 lb 8.5 oz (3870 g) APGAR: 8, 9  Newborn Delivery   Birth date/time:  10/08/2017 22:18:00 Delivery type:  Vaginal, Vacuum (Extractor)     Home with mother.  Caniyah Murley C 10/10/2017, 9:58 AM

## 2017-11-02 ENCOUNTER — Ambulatory Visit (INDEPENDENT_AMBULATORY_CARE_PROVIDER_SITE_OTHER): Payer: Commercial Managed Care - PPO | Admitting: Psychology

## 2017-11-02 DIAGNOSIS — F321 Major depressive disorder, single episode, moderate: Secondary | ICD-10-CM | POA: Diagnosis not present

## 2017-11-18 DIAGNOSIS — Z1389 Encounter for screening for other disorder: Secondary | ICD-10-CM | POA: Diagnosis not present

## 2017-11-19 ENCOUNTER — Ambulatory Visit (INDEPENDENT_AMBULATORY_CARE_PROVIDER_SITE_OTHER): Payer: Commercial Managed Care - PPO | Admitting: Psychology

## 2017-11-19 DIAGNOSIS — F321 Major depressive disorder, single episode, moderate: Secondary | ICD-10-CM | POA: Diagnosis not present

## 2017-11-30 ENCOUNTER — Ambulatory Visit: Payer: Commercial Managed Care - PPO | Admitting: Psychology

## 2017-11-30 DIAGNOSIS — F321 Major depressive disorder, single episode, moderate: Secondary | ICD-10-CM

## 2017-12-02 ENCOUNTER — Encounter: Payer: Self-pay | Admitting: Family Medicine

## 2017-12-14 ENCOUNTER — Ambulatory Visit (INDEPENDENT_AMBULATORY_CARE_PROVIDER_SITE_OTHER): Payer: Commercial Managed Care - PPO | Admitting: Family Medicine

## 2017-12-14 ENCOUNTER — Encounter: Payer: Self-pay | Admitting: Family Medicine

## 2017-12-14 VITALS — BP 116/74 | HR 70 | Temp 98.5°F | Ht 68.0 in | Wt 235.4 lb

## 2017-12-14 DIAGNOSIS — E039 Hypothyroidism, unspecified: Secondary | ICD-10-CM

## 2017-12-14 DIAGNOSIS — Z0000 Encounter for general adult medical examination without abnormal findings: Secondary | ICD-10-CM | POA: Diagnosis not present

## 2017-12-14 LAB — CBC WITH DIFFERENTIAL/PLATELET
BASOS PCT: 0.8 % (ref 0.0–3.0)
Basophils Absolute: 0.1 10*3/uL (ref 0.0–0.1)
EOS PCT: 2.3 % (ref 0.0–5.0)
Eosinophils Absolute: 0.2 10*3/uL (ref 0.0–0.7)
HCT: 41.9 % (ref 36.0–46.0)
Hemoglobin: 14.3 g/dL (ref 12.0–15.0)
LYMPHS ABS: 2.7 10*3/uL (ref 0.7–4.0)
Lymphocytes Relative: 32.9 % (ref 12.0–46.0)
MCHC: 34.1 g/dL (ref 30.0–36.0)
MCV: 86.9 fl (ref 78.0–100.0)
MONO ABS: 0.7 10*3/uL (ref 0.1–1.0)
Monocytes Relative: 8.8 % (ref 3.0–12.0)
NEUTROS ABS: 4.5 10*3/uL (ref 1.4–7.7)
NEUTROS PCT: 55.2 % (ref 43.0–77.0)
PLATELETS: 350 10*3/uL (ref 150.0–400.0)
RBC: 4.83 Mil/uL (ref 3.87–5.11)
RDW: 13.4 % (ref 11.5–15.5)
WBC: 8.1 10*3/uL (ref 4.0–10.5)

## 2017-12-14 LAB — LIPID PANEL
Cholesterol: 217 mg/dL — ABNORMAL HIGH (ref 0–200)
HDL: 55.4 mg/dL (ref >39.00)
LDL CALC: 135 mg/dL — AB (ref 0–99)
NONHDL: 161.49
Total CHOL/HDL Ratio: 4
Triglycerides: 130 mg/dL (ref 0.0–149.0)
VLDL: 26 mg/dL (ref 0.0–40.0)

## 2017-12-14 LAB — COMPREHENSIVE METABOLIC PANEL
ALT: 25 U/L (ref 0–35)
AST: 25 U/L (ref 0–37)
Albumin: 4.2 g/dL (ref 3.5–5.2)
Alkaline Phosphatase: 90 U/L (ref 39–117)
BUN: 18 mg/dL (ref 6–23)
CHLORIDE: 104 meq/L (ref 96–112)
CO2: 28 mEq/L (ref 19–32)
Calcium: 9.8 mg/dL (ref 8.4–10.5)
Creatinine, Ser: 0.87 mg/dL (ref 0.40–1.20)
GFR: 79.81 mL/min (ref >60.00)
GLUCOSE: 85 mg/dL (ref 70–99)
POTASSIUM: 5.1 meq/L (ref 3.5–5.1)
SODIUM: 143 meq/L (ref 135–145)
Total Bilirubin: 0.5 mg/dL (ref 0.2–1.2)
Total Protein: 6.9 g/dL (ref 6.0–8.3)

## 2017-12-14 LAB — T4, FREE: Free T4: 1.21 ng/dL (ref 0.60–1.60)

## 2017-12-14 LAB — TSH: TSH: 0.4 u[IU]/mL (ref 0.35–4.50)

## 2017-12-14 NOTE — Assessment & Plan Note (Signed)
Check thyroid function today. No changes made to rxs.

## 2017-12-14 NOTE — Patient Instructions (Signed)
Great to see you. I will call you with your lab results from today and you can view them online.   

## 2017-12-14 NOTE — Assessment & Plan Note (Signed)
Reviewed preventive care protocols, scheduled due services, and updated immunizations Discussed nutrition, exercise, diet, and healthy lifestyle.  Orders Placed This Encounter  Procedures  . CBC with Differential/Platelet  . Comprehensive metabolic panel  . Lipid panel  . TSH  . T4, free

## 2017-12-14 NOTE — Progress Notes (Signed)
Subjective:   Patient ID: Holly Singleton, female    DOB: 1986/01/11, 32 y.o.   MRN: [id number]  Holly Singleton is a pleasant 32 y.o. year old female who presents to clinic today with Annual Exam (Patient is here today for a CPE without PAP.  Last Pap done in July at [facility] and was WNL.  Her baby is 39.15 weeks old and healthy.  She is currently fasting.  She is breast feeding.)  on 12/14/2017  HPI:  Doing well. 9.5 weeks post partum.  Son is doing well.  She is breast feeding.  Still seeing Bambi Cottle regularly and has been since her mom was dying. She feels she is coping well and denies any symptoms of post partum depression.  Pregnancy and delivery went well-was diagnosed with pre eclampsia on his due date and was therefore induced that day.  Has been taking synthroid, was monitored by OBGYN during prengnacy.  Lab Results  Component Value Date   TSH 2.51 06/04/2016   Returning to work next week.   Current Outpatient Medications on File Prior to Visit  Medication Sig Dispense Refill  . levothyroxine (SYNTHROID, LEVOTHROID) 112 MCG tablet Take 1 tablet (112 mcg total) by mouth daily. 90 tablet 3  . norethindrone (MICRONOR,CAMILA,ERRIN) 0.35 MG tablet TK 1 T PO D  12  . Prenatal Vit-Fe Fumarate-FA (PRENATAL MULTIVITAMIN) TABS tablet Take 1 tablet by mouth daily at 12 noon.     No current facility-administered medications on file prior to visit.     Allergies  Allergen Reactions  . Lidocaine Other (See Comments)    Severe burning    Past Medical History:  Diagnosis Date  . Hypothyroidism   . Morbid obesity ([place])     Past Surgical History:  Procedure Laterality Date  . APPENDECTOMY      Family History  Problem Relation Age of Onset  . Hypertension Mother   . Cancer Mother 61       lung CA  . Hypertension Father     Social History   Socioeconomic History  . Marital status: Married    Spouse name: Not on file  . Number of children: Not on  file  . Years of education: Not on file  . Highest education level: Not on file  Occupational History  . 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 Financial resource strain: Not on file  . Food insecurity:    Worry: Not on file    Inability: Not on file  . Transportation needs:    Medical: Not on file    Non-medical: Not on file  Tobacco Use  . Smoking status: Never Smoker  . Smokeless tobacco: Never Used  Substance and Sexual Activity  . Alcohol use: Yes    Comment: occasionally  . Drug use: No  . Sexual activity: Yes  Lifestyle  . Physical activity:    Days per week: Not on file    Minutes per session: Not on file  . Stress: Not on file  Relationships  . Social connections:    Talks on phone: Not on file    Gets together: Not on file    Attends religious service: Not on file    Active member of club or organization: Not on file    Attends meetings of clubs or organizations: Not on file    Relationship status: Not on file  . Intimate partner violence:  Fear of current or ex partner: Not on file    Emotionally abused: Not on file    Physically abused: Not on file    Forced sexual activity: Not on file  Other Topics Concern  . Not on file  Social History Narrative   Recently divorced.   The PMH, PSH, Social History, Family History, Medications, and allergies have been reviewed in [facility], and have been updated if relevant.   Review of Systems  Constitutional: Negative.   HENT: Negative.   Eyes: Negative.   Respiratory: Negative.   Cardiovascular: Negative.   Gastrointestinal: Negative.   Endocrine: Negative.   Genitourinary: Negative.   Musculoskeletal: Negative.   Skin: Negative.   Allergic/Immunologic: Negative.   Neurological: Negative.   Hematological: Negative.   Psychiatric/Behavioral: Negative.   All other systems reviewed and are negative.      Objective:    BP 116/74 (BP Location: Left Arm, Patient  Position: Sitting, Cuff Size: Normal)   Pulse 70   Temp 98.5 F (36.9 C) (Oral)   Ht 5\' 8"  (1.727 m)   Wt 235 lb 6.4 oz (106.8 kg)   SpO2 98%   Breastfeeding? Yes   BMI 35.79 kg/m    Physical Exam   General:  Well-developed,well-nourished,in no acute distress; alert,appropriate and cooperative throughout examination Head:  normocephalic and atraumatic.   Eyes:  vision grossly intact, PERRL Ears:  R ear normal and L ear normal externally, TMs clear bilaterally Nose:  no external deformity.   Mouth:  good dentition.   Neck:  No deformities, masses, or tenderness noted.   Lungs:  Normal respiratory effort, chest expands symmetrically. Lungs are clear to auscultation, no crackles or wheezes. Heart:  Normal rate and regular rhythm. S1 and S2 normal without gallop, murmur, click, rub or other extra sounds. Abdomen:  Bowel sounds positive,abdomen soft and non-tender without masses, organomegaly or hernias notedn        Pap smear: performed Msk:  No deformity or scoliosis noted of thoracic or lumbar spine.   Extremities:  No clubbing, cyanosis, edema, or deformity noted with normal full range of motion of all joints.   Neurologic:  alert & oriented X3 and gait normal.   Skin:  Intact without suspicious lesions or rashes Psych:  Cognition and judgment appear intact. Alert and cooperative with normal attention span and concentration. No apparent delusions, illusions, hallucinations       Assessment & Plan:   Routine general medical examination at a health care facility  Hypothyroidism, unspecified type No follow-ups on file.

## 2017-12-21 ENCOUNTER — Ambulatory Visit (INDEPENDENT_AMBULATORY_CARE_PROVIDER_SITE_OTHER): Payer: Commercial Managed Care - PPO | Admitting: Psychology

## 2017-12-21 DIAGNOSIS — F321 Major depressive disorder, single episode, moderate: Secondary | ICD-10-CM | POA: Diagnosis not present

## 2018-01-04 ENCOUNTER — Ambulatory Visit (INDEPENDENT_AMBULATORY_CARE_PROVIDER_SITE_OTHER): Payer: 59 | Admitting: Psychology

## 2018-01-04 DIAGNOSIS — F321 Major depressive disorder, single episode, moderate: Secondary | ICD-10-CM | POA: Diagnosis not present

## 2018-01-18 ENCOUNTER — Ambulatory Visit (INDEPENDENT_AMBULATORY_CARE_PROVIDER_SITE_OTHER): Payer: 59 | Admitting: Psychology

## 2018-01-18 DIAGNOSIS — F321 Major depressive disorder, single episode, moderate: Secondary | ICD-10-CM | POA: Diagnosis not present

## 2018-02-01 ENCOUNTER — Ambulatory Visit (INDEPENDENT_AMBULATORY_CARE_PROVIDER_SITE_OTHER): Payer: 59 | Admitting: Psychology

## 2018-02-01 DIAGNOSIS — F321 Major depressive disorder, single episode, moderate: Secondary | ICD-10-CM

## 2018-02-02 ENCOUNTER — Encounter: Payer: Self-pay | Admitting: Family Medicine

## 2018-02-03 ENCOUNTER — Other Ambulatory Visit: Payer: Self-pay | Admitting: Family Medicine

## 2018-02-03 MED ORDER — SERTRALINE HCL 50 MG PO TABS: 50.0000 mg | ORAL_TABLET | Freq: Every day | ORAL | 3 refills | Status: DC

## 2018-02-15 ENCOUNTER — Ambulatory Visit (INDEPENDENT_AMBULATORY_CARE_PROVIDER_SITE_OTHER): Payer: 59 | Admitting: Psychology

## 2018-02-15 DIAGNOSIS — F4323 Adjustment disorder with mixed anxiety and depressed mood: Secondary | ICD-10-CM

## 2018-02-15 DIAGNOSIS — F321 Major depressive disorder, single episode, moderate: Secondary | ICD-10-CM | POA: Diagnosis not present

## 2018-02-28 ENCOUNTER — Ambulatory Visit (INDEPENDENT_AMBULATORY_CARE_PROVIDER_SITE_OTHER): Payer: 59 | Admitting: Family Medicine

## 2018-02-28 ENCOUNTER — Encounter: Payer: Self-pay | Admitting: Family Medicine

## 2018-02-28 VITALS — BP 134/80 | HR 61 | Temp 98.5°F | Ht 68.0 in | Wt 238.4 lb

## 2018-02-28 DIAGNOSIS — F418 Other specified anxiety disorders: Secondary | ICD-10-CM | POA: Diagnosis not present

## 2018-02-28 MED ORDER — SERTRALINE HCL 50 MG PO TABS: 50.0000 mg | ORAL_TABLET | Freq: Every day | ORAL | 6 refills | Status: DC

## 2018-02-28 NOTE — Progress Notes (Signed)
Subjective:   Patient ID: DELORIS MOGER, female    DOB: 06-24-1986, 32 y.o.   MRN: [id number]  JENIAH KISHI is a pleasant 32 y.o. year old female who presents to clinic today with Follow-up (Patient is here today to F/U with starting on Zoloft.  On 6.6.19 pt was started on Zoloft after visit with Bambay.  Had baby on 2.8.19.  She states that she feels that she is doing ok on it.  Not having the random panic feeling and does not cry anymore. She is currently fasting.)  on 02/28/2018  HPI:   Anxiety (with some depression)- pt has been seeing Bambi Cottle for psychotherapy.  Bambi sent me a message stating that she felt perhaps [place] would benefit from an SSRI so I contacted patient asking her for more details about her symptoms that she experiencing.  She sent the following my chart message:  Thanks for getting back so quick. Bambi said it would take at least 2-3 weeks to start working, so I'm aware. I'm anxious much of the time (I thought it was just from work but I think it's a lot of things). I use the word "overwhelmed" whenever I'm asked how I'm doing because that's how I feel 24/7, often to the point of tears. I sleep just fine when I can, but my schedule is so full and in dealing with baby, I only get 5-6.5 hrs per night until the weekends. I cry at least once per day, usually invoked by sad memories or an overwhelming sense of responsibility accompanied by a strong feeling of "I can't do this." This often happens at work when [employer] faced with a lot of projects/requests/deadlines at once. I can concentrate ok, inasmuch as I don't keep an eye on the clock because I constantly feel there is not enough time to do anything and I'm always on a schedule. And yes, I'm still breastfeeding. I hope that gives you an idea of what's going on with it all.  I sent her the following message in return:  That is all very helpful. Don't worry. We will help you to feel better. I do want you to know that most  of these medications can be excreted into the breast milk. I feel zoloft is one of the safest to use while breast feeding and would be great for treating both your symptoms of anxiety and depression. I will send this into your pharmacy and ask my medical assistant to contact to schedule a follow up appointment in 3- 4 weeks. Okay to cut the tablet in half (25 mg daily) for the first 5-7 days and then take a full tablet there after (50 mg ) to help minimize side effects.  Let me know if you have any questions.  Take care,  Dr. Loni Muse   She is here to follow this up today.  Feels better- less panic attacks and less crying spells.    Current Outpatient Medications on File Prior to Visit  Medication Sig Dispense Refill  . levothyroxine (SYNTHROID, LEVOTHROID) 112 MCG tablet Take 1 tablet (112 mcg total) by mouth daily. 90 tablet 3  . norethindrone (MICRONOR,CAMILA,ERRIN) 0.35 MG tablet TK 1 T PO D  12  . Prenatal Vit-Fe Fumarate-FA (PRENATAL MULTIVITAMIN) TABS tablet Take 1 tablet by mouth daily at 12 noon.     No current facility-administered medications on file prior to visit.     Allergies  Allergen Reactions  . Lidocaine Other (See Comments)  Severe burning    Past Medical History:  Diagnosis Date  . Hypothyroidism   . Morbid obesity ([place])     Past Surgical History:  Procedure Laterality Date  . APPENDECTOMY      Family History  Problem Relation Age of Onset  . Hypertension Mother   . Cancer Mother 48       lung CA  . Hypertension Father     Social History   Socioeconomic History  . Marital status: Married    Spouse name: Not on file  . Number of children: Not on file  . Years of education: Not on file  . Highest education level: Not on file  Occupational History  . 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 Financial resource strain: Not on file  . Food insecurity:    Worry: Not on file    Inability: Not on file  .  Transportation needs:    Medical: Not on file    Non-medical: Not on file  Tobacco Use  . Smoking status: Never Smoker  . Smokeless tobacco: Never Used  Substance and Sexual Activity  . Alcohol use: Yes    Comment: occasionally  . Drug use: No  . Sexual activity: Yes  Lifestyle  . Physical activity:    Days per week: Not on file    Minutes per session: Not on file  . Stress: Not on file  Relationships  . Social connections:    Talks on phone: Not on file    Gets together: Not on file    Attends religious service: Not on file    Active member of club or organization: Not on file    Attends meetings of clubs or organizations: Not on file    Relationship status: Not on file  . Intimate partner violence:    Fear of current or ex partner: Not on file    Emotionally abused: Not on file    Physically abused: Not on file    Forced sexual activity: Not on file  Other Topics Concern  . Not on file  Social History Narrative   Recently divorced.   The PMH, PSH, Social History, Family History, Medications, and allergies have been reviewed in [facility], and have been updated if relevant.  Review of Systems  Gastrointestinal: Negative.   Musculoskeletal: Negative.   Psychiatric/Behavioral: Negative for agitation, behavioral problems, confusion, decreased concentration, dysphoric mood, hallucinations, self-injury, sleep disturbance and suicidal ideas. The patient is not nervous/anxious and is not hyperactive.   All other systems reviewed and are negative.      Objective:    BP 134/80 (BP Location: Left Arm, Patient Position: Sitting, Cuff Size: Normal)   Pulse 61   Temp 98.5 F (36.9 C) (Oral)   Ht 5\' 8"  (1.727 m)   Wt 238 lb 6.4 oz (108.1 kg)   LMP 02/17/2018   SpO2 98%   BMI 36.25 kg/m    Physical Exam   General:  Well-developed,well-nourished,in no acute distress; alert,appropriate and cooperative throughout examination Head:  normocephalic and atraumatic.   Eyes:  vision  grossly intact, PERRL Ears:  R ear normal and L ear normal externally Nose:  no external deformity.   Mouth:  good dentition.   Neck:  No deformities, masses, or tenderness noted. Lungs:  Normal respiratory effort, chest expands symmetrically. Lungs are clear to auscultation, no crackles or wheezes. Heart:  Normal rate and regular rhythm. S1 and S2 normal  without gallop, murmur, click, rub or other extra sounds. Abdomen:  Bowel sounds positive,abdomen soft and non-tender without masses, organomegaly or hernias noted. Msk:  No deformity or scoliosis noted of thoracic or lumbar spine.   Extremities:  No clubbing, cyanosis, edema, or deformity noted with normal full range of motion of all joints.   Neurologic:  alert & oriented X3 and gait normal.   Skin:  Intact without suspicious lesions or rashes Cervical Nodes:  No lymphadenopathy noted Axillary Nodes:  No palpable lymphadenopathy Psych:  Cognition and judgment appear intact. Alert and cooperative with normal attention span and concentration. No apparent delusions, illusions, hallucinations       Assessment & Plan:   Depression with anxiety No follow-ups on file.

## 2018-02-28 NOTE — Assessment & Plan Note (Signed)
>  25 minutes spent in face to face time with patient, >50% spent in counselling or coordination of care Doing well.  Continue current dose of zoloft and psychotherapy. Discussed weaning off zoloft if she decides to stop it- she will contact me. The patient indicates understanding of these issues and agrees with the plan.

## 2018-02-28 NOTE — Patient Instructions (Signed)
Great to see you.  We are continuing zoloft 50 mg daily, keep seeing zoloft.

## 2018-03-01 ENCOUNTER — Ambulatory Visit (INDEPENDENT_AMBULATORY_CARE_PROVIDER_SITE_OTHER): Payer: 59 | Admitting: Psychology

## 2018-03-01 DIAGNOSIS — F331 Major depressive disorder, recurrent, moderate: Secondary | ICD-10-CM

## 2018-03-15 ENCOUNTER — Ambulatory Visit (INDEPENDENT_AMBULATORY_CARE_PROVIDER_SITE_OTHER): Payer: 59 | Admitting: Psychology

## 2018-03-15 DIAGNOSIS — F4323 Adjustment disorder with mixed anxiety and depressed mood: Secondary | ICD-10-CM | POA: Diagnosis not present

## 2018-03-15 DIAGNOSIS — F331 Major depressive disorder, recurrent, moderate: Secondary | ICD-10-CM

## 2018-03-29 ENCOUNTER — Ambulatory Visit (INDEPENDENT_AMBULATORY_CARE_PROVIDER_SITE_OTHER): Payer: 59 | Admitting: Psychology

## 2018-03-29 DIAGNOSIS — F331 Major depressive disorder, recurrent, moderate: Secondary | ICD-10-CM

## 2018-03-29 DIAGNOSIS — F4323 Adjustment disorder with mixed anxiety and depressed mood: Secondary | ICD-10-CM | POA: Diagnosis not present

## 2018-04-12 ENCOUNTER — Ambulatory Visit: Payer: 59 | Admitting: Psychology

## 2018-04-19 ENCOUNTER — Ambulatory Visit (INDEPENDENT_AMBULATORY_CARE_PROVIDER_SITE_OTHER): Payer: 59 | Admitting: Psychology

## 2018-04-19 DIAGNOSIS — F411 Generalized anxiety disorder: Secondary | ICD-10-CM

## 2018-04-26 ENCOUNTER — Ambulatory Visit (INDEPENDENT_AMBULATORY_CARE_PROVIDER_SITE_OTHER): Payer: 59 | Admitting: Psychology

## 2018-04-26 DIAGNOSIS — F411 Generalized anxiety disorder: Secondary | ICD-10-CM

## 2018-05-09 ENCOUNTER — Other Ambulatory Visit: Payer: Self-pay

## 2018-05-09 ENCOUNTER — Encounter: Payer: Self-pay | Admitting: Emergency Medicine

## 2018-05-09 ENCOUNTER — Ambulatory Visit
Admission: EM | Admit: 2018-05-09 | Discharge: 2018-05-09 | Disposition: A | Discharge status: Home / Self Care | Payer: 59 | Attending: Emergency Medicine | Admitting: Emergency Medicine

## 2018-05-09 DIAGNOSIS — J029 Acute pharyngitis, unspecified: Secondary | ICD-10-CM | POA: Diagnosis not present

## 2018-05-09 DIAGNOSIS — J069 Acute upper respiratory infection, unspecified: Secondary | ICD-10-CM

## 2018-05-09 DIAGNOSIS — J0110 Acute frontal sinusitis, unspecified: Secondary | ICD-10-CM | POA: Diagnosis not present

## 2018-05-09 DIAGNOSIS — M791 Myalgia, unspecified site: Secondary | ICD-10-CM

## 2018-05-09 HISTORY — DX: Depression, unspecified: F32.A

## 2018-05-09 HISTORY — DX: Major depressive disorder, single episode, unspecified: F32.9

## 2018-05-09 HISTORY — DX: Anxiety disorder, unspecified: F41.9

## 2018-05-09 LAB — RAPID STREP SCREEN (MED CTR MEBANE ONLY): STREPTOCOCCUS, GROUP A SCREEN (DIRECT): NEGATIVE

## 2018-05-09 MED ORDER — IBUPROFEN 600 MG PO TABS: 600.0000 mg | ORAL_TABLET | Freq: Four times a day (QID) | ORAL | 0 refills | Status: DC | PRN

## 2018-05-09 MED ORDER — DOXYCYCLINE HYCLATE 100 MG PO CAPS: 100.0000 mg | ORAL_CAPSULE | Freq: Two times a day (BID) | ORAL | 0 refills | Status: AC

## 2018-05-09 MED ORDER — FLUTICASONE PROPIONATE 50 MCG/ACT NA SUSP: 2.0000 | Freq: Every day | NASAL | 0 refills | Status: DC

## 2018-05-09 NOTE — ED Triage Notes (Signed)
Patient in today c/o generalized body aches, nasal congestion, cough, sinus/facial pain and lump in her throat x 2-3 days. Patient has felt feverish, but hasn't taken her temperature.

## 2018-05-09 NOTE — Discharge Instructions (Signed)
stop the Claritin-D, start Mucinex D, Flonase, saline nasal irrigation with distilled water and a Milta Deiters med sinus rinse, ibuprofen 600 mg take with 1 g of Tylenol gadolinium 3 or 4 times a day, and a wait-and-see prescription of doxycycline for sinus infection.  I would wait a week before filling this.

## 2018-05-09 NOTE — ED Provider Notes (Signed)
HPI  SUBJECTIVE:  Holly Singleton is a 32 y.o. female who presents with 3 days of URI-like symptoms with yellowish-greenish nasal congestion, rhinorrhea, postnasal drip, sore throat scribed as "a lump in my throat", sinus headache. She reports body aches, sinus pressure, malaise, fatigue.  States she has felt feverish, but has not checked her temperature.  She reports a cough productive of thick, green material, same as the nasal congestion. states that this is primarily in the morning.  No upper dental pain, facial swelling, wheezing, chest pain, shortness of breath.  She denies allergy symptoms.  She states that her 32-month-old was diagnosed with bilateral otitis media last week.  She has been taking Excedrin, Claritin-D, ibuprofen 400 mg.  The Excedrin and Claritin D helps with headache.  Symptoms are worse with bending forward.  She has a past medical history of sinusitis, allergies, but states that they are not bothering her now.  No history of asthma, emphysema, COPD, smoking, diabetes, hypertension.  LMP: 3 weeks ago.  Denies the possibility of being pregnant.  LOV:FIEP, Marciano Sequin, MD  Past Medical History:  Diagnosis Date  . Anxiety   . Depression   . Hypothyroidism   . Morbid obesity ([place])     Past Surgical History:  Procedure Laterality Date  . APPENDECTOMY      Family History  Problem Relation Age of Onset  . Hypertension Mother   . Cancer Mother 33       lung CA  . Hypertension Father     Social History   Tobacco Use  . Smoking status: Never Smoker  . Smokeless tobacco: Never Used  Substance Use Topics  . Alcohol use: Yes    Comment: occasionally  . Drug use: No    No current facility-administered medications for this encounter.   Current Outpatient Medications:  .  levothyroxine (SYNTHROID, LEVOTHROID) 112 MCG tablet, Take 1 tablet (112 mcg total) by mouth daily., Disp: 90 tablet, Rfl: 3 .  sertraline (ZOLOFT) 50 MG tablet, Take 1 tablet (50 mg total) by mouth  daily., Disp: 30 tablet, Rfl: 6 .  doxycycline (VIBRAMYCIN) 100 MG capsule, Take 1 capsule (100 mg total) by mouth 2 (two) times daily for 7 days., Disp: 14 capsule, Rfl: 0 .  fluticasone (FLONASE) 50 MCG/ACT nasal spray, Place 2 sprays into both nostrils daily., Disp: 16 g, Rfl: 0 .  ibuprofen (ADVIL,MOTRIN) 600 MG tablet, Take 1 tablet (600 mg total) by mouth every 6 (six) hours as needed., Disp: 30 tablet, Rfl: 0  Allergies  Allergen Reactions  . Lidocaine Other (See Comments)    Severe burning     ROS  As noted in HPI.   Physical Exam  BP 116/74 (BP Location: Left Arm)   Pulse 87   Temp 97.8 F (36.6 C) (Oral)   Resp 16   Ht 5' 8.5" (1.74 m)   Wt 106.6 kg   LMP 04/18/2018 (Approximate)   SpO2 98%   BMI 35.21 kg/m   Constitutional: Well developed, well nourished, no acute distress Eyes:  EOMI, conjunctiva normal bilaterally HENT: Normocephalic, atraumatic,mucus membranes moist. + injection right TM, but sharp light reflex, no dullness, bulging.  Left TM normal.  Erythematous, swollen turbinates.  Positive clear nasal congestion.  Positive frontal sinus tenderness.  No maxillary sinus tenderness.  Positive postnasal drip with cobblestoning.  Tonsils normal uvula midline. Neck: No cervical lymphadenopathy Respiratory: Normal inspiratory effort, lungs clear bilaterally Cardiovascular: Normal rate regular rhythm no murmurs rubs or gallops  GI:  nondistended skin: No rash, skin intact Musculoskeletal: no deformities Neurologic: Alert & oriented x 3, no focal neuro deficits Psychiatric: Speech and behavior appropriate   ED Course   Medications - No data to display  Orders Placed This Encounter  Procedures  . Rapid Strep Screen ([facility] ONLY)    Standing Status:   Standing    Number of Occurrences:   1  . Culture, group A strep    Standing Status:   Standing    Number of Occurrences:   1    Results for orders placed or performed during the hospital  encounter of 05/09/18 (from the past 24 hour(s))  Rapid Strep Screen ([facility] ONLY)     Status: None   Collection Time: 05/09/18  8:50 AM  Result Value Ref Range   Streptococcus, Group A Screen (Direct) NEGATIVE NEGATIVE   No results found.  ED Clinical Impression  Upper respiratory tract infection, unspecified type  Acute non-recurrent frontal sinusitis   ED Assessment/Plan  Patient consistent with a URI/viral sinusitis.  We will have her stop the Claritin-D, start Mucinex D, Flonase, saline nasal irrigation with distilled water and a Milta Deiters med sinus rinse, ibuprofen 600 mg take with 1 g of Tylenol, and a wait-and-see prescription of doxycycline for a sinus infection.  Discussed with her if she does not have any clear indications for this and advised her to wait a week before filling this.  She agrees with plan.  Meds ordered this encounter  Medications  . fluticasone (FLONASE) 50 MCG/ACT nasal spray    Sig: Place 2 sprays into both nostrils daily.    Dispense:  16 g    Refill:  0  . ibuprofen (ADVIL,MOTRIN) 600 MG tablet    Sig: Take 1 tablet (600 mg total) by mouth every 6 (six) hours as needed.    Dispense:  30 tablet    Refill:  0  . doxycycline (VIBRAMYCIN) 100 MG capsule    Sig: Take 1 capsule (100 mg total) by mouth 2 (two) times daily for 7 days.    Dispense:  14 capsule    Refill:  0    *This clinic note was created using [employer]. Therefore, there may be occasional mistakes despite careful proofreading.   ?   Melynda Ripple, MD 05/10/18 [phone]

## 2018-05-10 ENCOUNTER — Ambulatory Visit (INDEPENDENT_AMBULATORY_CARE_PROVIDER_SITE_OTHER): Payer: 59 | Admitting: Psychology

## 2018-05-10 DIAGNOSIS — F411 Generalized anxiety disorder: Secondary | ICD-10-CM

## 2018-05-11 LAB — CULTURE, GROUP A STREP (THRC)

## 2018-05-24 ENCOUNTER — Ambulatory Visit (INDEPENDENT_AMBULATORY_CARE_PROVIDER_SITE_OTHER): Payer: 59 | Admitting: Psychology

## 2018-05-24 DIAGNOSIS — F411 Generalized anxiety disorder: Secondary | ICD-10-CM

## 2018-06-02 ENCOUNTER — Ambulatory Visit
Admission: EM | Admit: 2018-06-02 | Discharge: 2018-06-02 | Disposition: A | Discharge status: Home / Self Care | Payer: 59 | Attending: Emergency Medicine | Admitting: Emergency Medicine

## 2018-06-02 ENCOUNTER — Other Ambulatory Visit: Payer: Self-pay

## 2018-06-02 DIAGNOSIS — H1031 Unspecified acute conjunctivitis, right eye: Secondary | ICD-10-CM

## 2018-06-02 MED ORDER — POLYETHYL GLYCOL-PROPYL GLYCOL 0.4-0.3 % OP SOLN: 1.0000 [drp] | Freq: Four times a day (QID) | OPHTHALMIC | 0 refills | Status: DC | PRN

## 2018-06-02 MED ORDER — TETRACAINE HCL 0.5 % OP SOLN: 1.0000 [drp] | Freq: Once | OPHTHALMIC | Status: DC

## 2018-06-02 MED ORDER — CIPROFLOXACIN HCL 0.3 % OP SOLN: OPHTHALMIC | 0 refills | Status: DC

## 2018-06-02 MED ORDER — OLOPATADINE HCL 0.1 % OP SOLN: 1.0000 [drp] | Freq: Two times a day (BID) | OPHTHALMIC | 0 refills | Status: DC

## 2018-06-02 MED ORDER — FLUORESCEIN SODIUM 1 MG OP STRP: 1.0000 | ORAL_STRIP | Freq: Once | OPHTHALMIC | Status: DC

## 2018-06-02 NOTE — ED Provider Notes (Signed)
HPI  SUBJECTIVE:  Holly Singleton is a 32 y.o. female who presents with right conjunctival injection, itchy, watery eye for the past 4 days.  She reports upper eyelid swelling.  She reports blurry vision when her eye is watering. , scant yellowish-green discharge.  Reports right eye pain starting yesterday with a foreign body sensation.  No periorbital swelling, erythema.  She reports a mild headache described as a "band around my head",  mild photophobia.  No vomiting.  No pain with extraocular movements.  No double vision, halos around lights, eye pain worse in the dark.  She does not have any contacts with pinkeye, no trauma to the eye, no exposure to chemicals.  She wears glasses, but not contacts.  She thought that this was initially due to allergies, so she tried Sudafed, lubricating eyedrop and ibuprofen for the headaches.  The lubricating eyedrops to help with the redness.  Symptoms are worse with eyelid palpation.  She has a past medical history of seasonal allergies and they bother her around this time of year.  No history of diabetes, glaucoma.  LMP: Last week.  Denies possibility of being pregnant.  ZOX:WRUE, Marciano Sequin, MD Ophthalmology: [facility].    Past Medical History:  Diagnosis Date  . Anxiety   . Depression   . Hypothyroidism   . Morbid obesity ([place])     Past Surgical History:  Procedure Laterality Date  . APPENDECTOMY      Family History  Problem Relation Age of Onset  . Hypertension Mother   . Cancer Mother 38       lung CA  . Hypertension Father     Social History   Tobacco Use  . Smoking status: Never Smoker  . Smokeless tobacco: Never Used  Substance Use Topics  . Alcohol use: Yes    Comment: occasionally  . Drug use: No     Current Facility-Administered Medications:  .  fluorescein ophthalmic strip 1 strip, 1 strip, Right Eye, Once, Melynda Ripple, MD .  tetracaine (PONTOCAINE) 0.5 % ophthalmic solution 1 drop, 1 drop, Right Eye, Once,  Melynda Ripple, MD  Current Outpatient Medications:  .  ibuprofen (ADVIL,MOTRIN) 600 MG tablet, Take 1 tablet (600 mg total) by mouth every 6 (six) hours as needed., Disp: 30 tablet, Rfl: 0 .  levothyroxine (SYNTHROID, LEVOTHROID) 112 MCG tablet, Take 1 tablet (112 mcg total) by mouth daily., Disp: 90 tablet, Rfl: 3 .  sertraline (ZOLOFT) 50 MG tablet, Take 1 tablet (50 mg total) by mouth daily., Disp: 30 tablet, Rfl: 6 .  ciprofloxacin (CILOXAN) 0.3 % ophthalmic solution, 1-2 drops in affected eye 4 times/day x 5 days, Disp: 5 mL, Rfl: 0 .  olopatadine (PATANOL) 0.1 % ophthalmic solution, Place 1 drop into both eyes 2 (two) times daily., Disp: 5 mL, Rfl: 0 .  Polyethyl Glycol-Propyl Glycol (SYSTANE) 0.4-0.3 % SOLN, Apply 1 drop to eye 4 (four) times daily as needed., Disp: 5 mL, Rfl: 0  Allergies  Allergen Reactions  . Lidocaine Other (See Comments)    Severe burning     ROS  As noted in HPI.   Physical Exam  BP 133/72 (BP Location: Left Arm)   Pulse 77   Temp 98.9 F (37.2 C) (Oral)   Resp 16   Ht 5' 8.5" (1.74 m)   Wt 104.3 kg   LMP 05/24/2018   SpO2 99%   Breastfeeding? No   BMI 34.46 kg/m   Constitutional: Well developed, well nourished, no acute  distress Eyes: PERRLA, no pain with EOMs, EOMI, right conjunctival injection.  No obvious purulent drainage.  No incoming stye seen on magnification.  Mild upper eyelid swelling.  No periorbital erythema, edema.  No direct or consensual photophobia.  No foreign body seen on lid eversion.  No corneal foreign body seen on magnification, flourescin exam negative for corneal abrasion. HENT: Normocephalic, atraumatic,mucus membranes moist   Visual Acuity  Right Eye Distance: 20/20(uncorrected) Left Eye Distance: 20/20(uncorrected) Bilateral Distance: 20/20(uncorrected)  Right Eye Near:   Left Eye Near:    Bilateral Near:    Respiratory: Normal inspiratory effort Cardiovascular: Normal rate GI: nondistended skin: No rash,  skin intact Musculoskeletal: no deformities Neurologic: Alert & oriented x 3, no focal neuro deficits Psychiatric: Speech and behavior appropriate   ED Course   Medications  tetracaine (PONTOCAINE) 0.5 % ophthalmic solution 1 drop (has no administration in time range)  fluorescein ophthalmic strip 1 strip (has no administration in time range)    No orders of the defined types were placed in this encounter.   No results found for this or any previous visit (from the past 24 hour(s)). No results found.  ED Clinical Impression  Acute conjunctivitis of right eye, unspecified acute conjunctivitis type   ED Assessment/Plan  Suspect allergic versus infectious conjunctivitis.  No evidence of glaucoma.  Visual acuity is equal bilaterally.  Will send home with Systane, Patanol, she is to continue her antihistamine and Sudafed.  If this does not resolve her symptoms, will send her home with Cipro eyedrops.  Advised cool/warm compresses.  She will need to throw away her make-up in case this is infectious.  Follow-up with  eye if not getting any better, to the ER if she gets worse.   Discussed MDM, treatment plan, and plan for follow-up with patient. Discussed sn/sx that should prompt return to the ED. patient agrees with plan.   Meds ordered this encounter  Medications  . tetracaine (PONTOCAINE) 0.5 % ophthalmic solution 1 drop  . fluorescein ophthalmic strip 1 strip  . Polyethyl Glycol-Propyl Glycol (SYSTANE) 0.4-0.3 % SOLN    Sig: Apply 1 drop to eye 4 (four) times daily as needed.    Dispense:  5 mL    Refill:  0  . ciprofloxacin (CILOXAN) 0.3 % ophthalmic solution    Sig: 1-2 drops in affected eye 4 times/day x 5 days    Dispense:  5 mL    Refill:  0  . olopatadine (PATANOL) 0.1 % ophthalmic solution    Sig: Place 1 drop into both eyes 2 (two) times daily.    Dispense:  5 mL    Refill:  0    *This clinic note was created using [employer]. Therefore,  there may be occasional mistakes despite careful proofreading.   ?   Melynda Ripple, MD 06/02/18 1559

## 2018-06-02 NOTE — Discharge Instructions (Addendum)
Systane as much as you want for eye lubrication, Patanol, continue your antihistamine and Sudafed.  If you have a lot of nasal congestion, continue Flonase.  If this does not resolve your symptoms, you can try the Ciloxan eyedrops.  Cool compresses.  Would advise you to throw away your current eye make-up and get new eye make-up in case this is infectious.  Do not wear eye make-up while you are having symptoms.  Follow-up with [facility] if not getting any better, to the ER if you get worse.

## 2018-06-02 NOTE — ED Triage Notes (Signed)
Patient complains of right eye pain that started originally on Monday. Patient states that she feels like something is in the eye. Has tried lubricating drops without relief.

## 2018-06-07 ENCOUNTER — Ambulatory Visit (INDEPENDENT_AMBULATORY_CARE_PROVIDER_SITE_OTHER): Payer: 59 | Admitting: Psychology

## 2018-06-07 DIAGNOSIS — F411 Generalized anxiety disorder: Secondary | ICD-10-CM | POA: Diagnosis not present

## 2018-06-21 ENCOUNTER — Ambulatory Visit (INDEPENDENT_AMBULATORY_CARE_PROVIDER_SITE_OTHER): Payer: 59 | Admitting: Psychology

## 2018-06-21 DIAGNOSIS — F411 Generalized anxiety disorder: Secondary | ICD-10-CM | POA: Diagnosis not present

## 2018-07-05 ENCOUNTER — Ambulatory Visit (INDEPENDENT_AMBULATORY_CARE_PROVIDER_SITE_OTHER): Payer: 59 | Admitting: Psychology

## 2018-07-05 DIAGNOSIS — F411 Generalized anxiety disorder: Secondary | ICD-10-CM | POA: Diagnosis not present

## 2018-07-19 ENCOUNTER — Ambulatory Visit (INDEPENDENT_AMBULATORY_CARE_PROVIDER_SITE_OTHER): Payer: 59 | Admitting: Psychology

## 2018-07-19 DIAGNOSIS — F411 Generalized anxiety disorder: Secondary | ICD-10-CM | POA: Diagnosis not present

## 2018-08-02 ENCOUNTER — Ambulatory Visit (INDEPENDENT_AMBULATORY_CARE_PROVIDER_SITE_OTHER): Payer: 59 | Admitting: Psychology

## 2018-08-02 DIAGNOSIS — F411 Generalized anxiety disorder: Secondary | ICD-10-CM | POA: Diagnosis not present

## 2018-08-23 ENCOUNTER — Ambulatory Visit: Payer: 59 | Admitting: Psychology

## 2018-08-27 ENCOUNTER — Other Ambulatory Visit: Payer: Self-pay | Admitting: Family Medicine

## 2018-08-30 ENCOUNTER — Ambulatory Visit (INDEPENDENT_AMBULATORY_CARE_PROVIDER_SITE_OTHER): Payer: 59 | Admitting: Psychology

## 2018-08-30 DIAGNOSIS — F411 Generalized anxiety disorder: Secondary | ICD-10-CM | POA: Diagnosis not present

## 2018-09-08 ENCOUNTER — Other Ambulatory Visit: Payer: Self-pay

## 2018-09-08 ENCOUNTER — Encounter: Payer: Self-pay | Admitting: Emergency Medicine

## 2018-09-08 ENCOUNTER — Ambulatory Visit (INDEPENDENT_AMBULATORY_CARE_PROVIDER_SITE_OTHER): Payer: 59

## 2018-09-08 ENCOUNTER — Ambulatory Visit
Admission: EM | Admit: 2018-09-08 | Discharge: 2018-09-08 | Disposition: A | Discharge status: Home / Self Care | Payer: 59 | Attending: Emergency Medicine | Admitting: Emergency Medicine

## 2018-09-08 DIAGNOSIS — R05 Cough: Secondary | ICD-10-CM | POA: Diagnosis not present

## 2018-09-08 DIAGNOSIS — R51 Headache: Secondary | ICD-10-CM | POA: Diagnosis not present

## 2018-09-08 DIAGNOSIS — R509 Fever, unspecified: Secondary | ICD-10-CM | POA: Diagnosis not present

## 2018-09-08 DIAGNOSIS — R519 Headache, unspecified: Secondary | ICD-10-CM

## 2018-09-08 LAB — BASIC METABOLIC PANEL
ANION GAP: 9 (ref 5–15)
BUN: 9 mg/dL (ref 6–20)
CO2: 24 mmol/L (ref 22–32)
Calcium: 8.3 mg/dL — ABNORMAL LOW (ref 8.9–10.3)
Chloride: 106 mmol/L (ref 98–111)
Creatinine, Ser: 0.89 mg/dL (ref 0.44–1.00)
GLUCOSE: 93 mg/dL (ref 70–99)
Potassium: 3.8 mmol/L (ref 3.5–5.1)
Sodium: 139 mmol/L (ref 135–145)

## 2018-09-08 LAB — TSH: TSH: 3.035 u[IU]/mL (ref 0.350–4.500)

## 2018-09-08 LAB — CBC WITH DIFFERENTIAL/PLATELET
Abs Immature Granulocytes: 0.4 10*3/uL — ABNORMAL HIGH (ref 0.00–0.07)
BAND NEUTROPHILS: 1 %
BASOS PCT: 0 %
Basophils Absolute: 0 10*3/uL (ref 0.0–0.1)
Eosinophils Absolute: 0.1 10*3/uL (ref 0.0–0.5)
Eosinophils Relative: 1 %
HCT: 39.7 % (ref 36.0–46.0)
Hemoglobin: 13 g/dL (ref 12.0–15.0)
Lymphocytes Relative: 57 %
Lymphs Abs: 6.7 10*3/uL — ABNORMAL HIGH (ref 0.7–4.0)
MCH: 27.8 pg (ref 26.0–34.0)
MCHC: 32.7 g/dL (ref 30.0–36.0)
MCV: 85 fL (ref 80.0–100.0)
METAMYELOCYTES PCT: 3 %
Monocytes Absolute: 0.6 10*3/uL (ref 0.1–1.0)
Monocytes Relative: 5 %
NEUTROS ABS: 4 10*3/uL (ref 1.7–7.7)
NRBC: 0 % (ref 0.0–0.2)
Neutrophils Relative %: 33 %
PLATELETS: 254 10*3/uL (ref 150–400)
RBC: 4.67 MIL/uL (ref 3.87–5.11)
RDW: 14.1 % (ref 11.5–15.5)
WBC MORPHOLOGY: ABNORMAL
WBC: 11.7 10*3/uL — AB (ref 4.0–10.5)

## 2018-09-08 LAB — RAPID INFLUENZA A&B ANTIGENS
Influenza A (ARMC): NEGATIVE
Influenza B (ARMC): NEGATIVE

## 2018-09-08 LAB — PATHOLOGIST SMEAR REVIEW

## 2018-09-08 MED ORDER — BENZONATATE 200 MG PO CAPS: 200.0000 mg | ORAL_CAPSULE | Freq: Three times a day (TID) | ORAL | 0 refills | Status: DC | PRN

## 2018-09-08 MED ORDER — AEROCHAMBER PLUS MISC: 2 refills | Status: DC

## 2018-09-08 MED ORDER — AZITHROMYCIN 250 MG PO TABS: ORAL_TABLET | ORAL | 0 refills | Status: DC

## 2018-09-08 MED ORDER — IBUPROFEN 600 MG PO TABS: 600.0000 mg | ORAL_TABLET | Freq: Four times a day (QID) | ORAL | 0 refills | Status: DC | PRN

## 2018-09-08 MED ORDER — ALBUTEROL SULFATE HFA 108 (90 BASE) MCG/ACT IN AERS: 1.0000 | INHALATION_SPRAY | Freq: Four times a day (QID) | RESPIRATORY_TRACT | 0 refills | Status: DC | PRN

## 2018-09-08 MED ORDER — HYDROCOD POLST-CPM POLST ER 10-8 MG/5ML PO SUER: 5.0000 mL | Freq: Two times a day (BID) | ORAL | 0 refills | Status: DC | PRN

## 2018-09-08 NOTE — ED Provider Notes (Signed)
HPI  SUBJECTIVE:  Holly Singleton is a 33 y.o. female who presents with daily cyclical fevers, difficulty with thermoregulation, and morning headaches for the past week.  She states around 1930 every night she gets cold, shivering, states that she has fevers during this time T-max 100.7.  This lasts for about 2 hours.  She states that she feels hot in the morning, this also occurs at the same time every morning.  She is afebrile during this time.  She also states that she is working up with daily headaches during this time which gets better over the day but never completely resolves.  She states that the headache is located in her temples, describes it as pounding.  She reports a dry cough, decreased exercise tolerance and states that her lungs feel tight when she exercises.  She has tried Claritin-D, ibuprofen, Robitussin.  The ibuprofen helps with her headache.  Symptoms are worse in the morning.  She denies chest pain, shortness of breath at rest, wheezing, allergy symptoms.  No nasal congestion, rhinorrhea, sinus pain or pressure, postnasal drip.  No dental pain, ear pain, pain with opening /closing her mouth.  No visual changes, dysarthria, arm or leg weakness, aphasia, photophobia, neck stiffness, rash, seizures, syncope.  Denies unintentional weight gain, weight loss, hair or nail changes.  No recent international travel.  No recent sore throat.  No blood transfusion, IV drug use, she is in a long-term monogamous relationship with a partner who is HIV negative. She took ibuprofen within 4 to 6 hours of evaluation.  She has a past medical history of hypothyroidism, but has been on the same thyroid medications for 8 years.  She also has a history of depression.  She denies any new medications or change in her daily medications.  No history of pseudotumor cerebri, diabetes, hypertension.  LMP: 1 week ago.  She denies the possibility of being pregnant.  LFY:BOFB, Marciano Sequin, MD   Past Medical History:   Diagnosis Date  . Anxiety   . Depression   . Hypothyroidism   . Morbid obesity ([place])     Past Surgical History:  Procedure Laterality Date  . APPENDECTOMY      Family History  Problem Relation Age of Onset  . Hypertension Mother   . Cancer Mother 57       lung CA  . Hypertension Father     Social History   Tobacco Use  . Smoking status: Never Smoker  . Smokeless tobacco: Never Used  Substance Use Topics  . Alcohol use: Yes    Comment: occasionally  . Drug use: No    No current facility-administered medications for this encounter.   Current Outpatient Medications:  .  levothyroxine (SYNTHROID, LEVOTHROID) 112 MCG tablet, TAKE 1 TABLET(112 MCG) BY MOUTH DAILY, Disp: 90 tablet, Rfl: 2 .  sertraline (ZOLOFT) 50 MG tablet, Take 1 tablet (50 mg total) by mouth daily., Disp: 30 tablet, Rfl: 6 .  albuterol (PROVENTIL HFA;VENTOLIN HFA) 108 (90 Base) MCG/ACT inhaler, Inhale 1-2 puffs into the lungs every 6 (six) hours as needed for wheezing or shortness of breath., Disp: 1 Inhaler, Rfl: 0 .  azithromycin (ZITHROMAX) 250 MG tablet, 2 tabs po on day 1, 1 tab po on days 2-5, Disp: 6 tablet, Rfl: 0 .  benzonatate (TESSALON) 200 MG capsule, Take 1 capsule (200 mg total) by mouth 3 (three) times daily as needed for cough., Disp: 30 capsule, Rfl: 0 .  chlorpheniramine-HYDROcodone (TUSSIONEX PENNKINETIC ER) 10-8 MG/5ML SUER,  Take 5 mLs by mouth every 12 (twelve) hours as needed for cough., Disp: 60 mL, Rfl: 0 .  ibuprofen (ADVIL,MOTRIN) 600 MG tablet, Take 1 tablet (600 mg total) by mouth every 6 (six) hours as needed., Disp: 30 tablet, Rfl: 0 .  Spacer/Aero-Holding Chambers (AEROCHAMBER PLUS) inhaler, Use as instructed, Disp: 1 each, Rfl: 2  Allergies  Allergen Reactions  . Lidocaine Other (See Comments)    Severe burning     ROS  As noted in HPI.   Physical Exam  BP 123/72 (BP Location: Left Arm)   Pulse 78   Temp 98 F (36.7 C) (Oral)   Resp 18   Ht 5' 8.5" (1.74 m)    Wt 106.6 kg   LMP 08/25/2018   SpO2 96%   BMI 35.21 kg/m   Constitutional: Well developed, well nourished, no acute distress Eyes: PERRL, EOMI, conjunctiva normal bilaterally.  No photophobia. HENT: Normocephalic, atraumatic,mucus membranes moist.  No nasal congestion.  Normal turbinates.  No sinus tenderness.  TMs normal bilaterally.  No tenderness, crepitus over the TMJ bilaterally.  Dentition normal.  Normal oropharynx.  No postnasal drip. Neck: No cervical lymphadenopathy, trapezial tenderness, muscle spasm, meningismus.  Nontender thyroid.  No palpable neck masses. Respiratory: Clear to auscultation bilaterally, no rales, no wheezing, no rhonchi Cardiovascular: Normal rate and rhythm, no murmurs, no gallops, no rubs GI:  nondistended,  skin: No rash, skin intact Musculoskeletal: No edema, no tenderness, no deformities Neurologic: Alert & oriented x 3, CN III-XII intact, finger-nose, heel shin within normal limits, tandem gait steady, Romberg negative.  No motor deficits, sensation grossly intact Psychiatric: Speech and behavior appropriate   ED Course   Medications - No data to display  Orders Placed This Encounter  Procedures  . Rapid Influenza A&B Antigens ([facility] only)    Standing Status:   Standing    Number of Occurrences:   1  . DG Chest 2 View    Standing Status:   Standing    Number of Occurrences:   1    Order Specific Question:   Reason for Exam (SYMPTOM  OR DIAGNOSIS REQUIRED)    Answer:   cough fever  . CBC with Differential    Standing Status:   Standing    Number of Occurrences:   1  . Basic metabolic panel    Standing Status:   Standing    Number of Occurrences:   1  . Pathologist smear review    Standing Status:   Standing    Number of Occurrences:   1   Results for orders placed or performed during the hospital encounter of 09/08/18 (from the past 24 hour(s))  Rapid Influenza A&B Antigens ([facility] only)     Status: None   Collection Time: 09/08/18 10:44  AM  Result Value Ref Range   Influenza A ([facility]) NEGATIVE NEGATIVE   Influenza B ([facility]) NEGATIVE NEGATIVE  CBC with Differential     Status: Abnormal   Collection Time: 09/08/18 11:51 AM  Result Value Ref Range   WBC 11.7 (H) 4.0 - 10.5 K/uL   RBC 4.67 3.87 - 5.11 MIL/uL   Hemoglobin 13.0 12.0 - 15.0 g/dL   HCT 39.7 36.0 - 46.0 %   MCV 85.0 80.0 - 100.0 fL   MCH 27.8 26.0 - 34.0 pg   MCHC 32.7 30.0 - 36.0 g/dL   RDW 14.1 11.5 - 15.5 %   Platelets 254 150 - 400 K/uL   nRBC 0.0 0.0 - 0.2 %  Neutrophils Relative % 33 %   Neutro Abs 4.0 1.7 - 7.7 K/uL   Band Neutrophils 1 %   Lymphocytes Relative 57 %   Lymphs Abs 6.7 (H) 0.7 - 4.0 K/uL   Monocytes Relative 5 %   Monocytes Absolute 0.6 0.1 - 1.0 K/uL   Eosinophils Relative 1 %   Eosinophils Absolute 0.1 0.0 - 0.5 K/uL   Basophils Relative 0 %   Basophils Absolute 0.0 0.0 - 0.1 K/uL   WBC Morphology Abnormal lymphocytes present    Metamyelocytes Relative 3 %   Abs Immature Granulocytes 0.40 (H) 0.00 - 0.07 K/uL  Basic metabolic panel     Status: Abnormal   Collection Time: 09/08/18 11:51 AM  Result Value Ref Range   Sodium 139 135 - 145 mmol/L   Potassium 3.8 3.5 - 5.1 mmol/L   Chloride 106 98 - 111 mmol/L   CO2 24 22 - 32 mmol/L   Glucose, Bld 93 70 - 99 mg/dL   BUN 9 6 - 20 mg/dL   Creatinine, Ser 0.89 0.44 - 1.00 mg/dL   Calcium 8.3 (L) 8.9 - 10.3 mg/dL   GFR calc non Af Amer >60 >60 mL/min   GFR calc Af Amer >60 >60 mL/min   Anion gap 9 5 - 15  TSH     Status: None   Collection Time: 09/08/18 11:51 AM  Result Value Ref Range   TSH 3.035 0.350 - 4.500 uIU/mL  Pathologist smear review     Status: None   Collection Time: 09/08/18 11:57 AM  Result Value Ref Range   Path Review Peripheral blood smear is reviewed.    Dg Chest 2 View  Result Date: 09/08/2018 CLINICAL DATA:  Cough and fever with shortness of breath EXAM: CHEST - 2 VIEW COMPARISON:  None. FINDINGS: Lungs are clear. Heart size and pulmonary vascularity are  normal. No adenopathy. No bone lesions. IMPRESSION: No edema or consolidation. Electronically Signed   By: Lowella Grip III M.D.   On: 09/08/2018 12:26    ED Clinical Impression  Fever, unspecified  Acute nonintractable headache, unspecified headache type   ED Assessment/Plan  Flu negative.  Checking chest x-ray because of cough and fever to rule out any atypical pneumonia.  Will check a TSH because of the difficulty thermaoregulating.  Also CBC, BMP.  No UA as patient has no urinary complaints.  She denies the possibility of being pregnant.  Discussed with the patient that she will need to take the TSH results if abnormal to her physician to have that further managed.  will call her with the TSH if it comes back abnormal.  Reviewed imaging independently. Normal CXR. See radiology report for full details.  No evidence of meningitis. [facility] narcotic database reviewed.  No opiate prescriptions in 2 years  BMP normal.  Mild leukocytosis, no left shift.  Patient has an atypical lymphocytosis, so we will send this off for peripheral smear.  Suspect viral etiology.  She states that this does not feel like mono that she had in 2013.  She has no risk factors for HIV.  TSH pending.  Will send home with Tessalon, Tussionex, ibuprofen combined with Tylenol, Albuterol inhaler with a spacer for her to use before running.  We talked about doing azithromycin just in case she has an atypical pneumonia and she has agreed with that, so sent home with a Z-Pak as well. She is to follow up PCP follow-up in several days for further evaluation. To [facility] if she gets worse.  Peripheral smear reveals absolute lymphocytosis with atypical lymphocytes, thought to be reactive.  Normal platelets and RBCs.  TSH is normal.  Sent Note to the patient's primary care physician regarding today's visit.  Discussed labs, imaging, MDM, treatment plan, and plan for follow-up with patient Discussed sn/sx that should  prompt return to the ED. patient agrees with plan.   Meds ordered this encounter  Medications  . ibuprofen (ADVIL,MOTRIN) 600 MG tablet    Sig: Take 1 tablet (600 mg total) by mouth every 6 (six) hours as needed.    Dispense:  30 tablet    Refill:  0  . albuterol (PROVENTIL HFA;VENTOLIN HFA) 108 (90 Base) MCG/ACT inhaler    Sig: Inhale 1-2 puffs into the lungs every 6 (six) hours as needed for wheezing or shortness of breath.    Dispense:  1 Inhaler    Refill:  0  . Spacer/Aero-Holding Chambers (AEROCHAMBER PLUS) inhaler    Sig: Use as instructed    Dispense:  1 each    Refill:  2  . chlorpheniramine-HYDROcodone (TUSSIONEX PENNKINETIC ER) 10-8 MG/5ML SUER    Sig: Take 5 mLs by mouth every 12 (twelve) hours as needed for cough.    Dispense:  60 mL    Refill:  0  . benzonatate (TESSALON) 200 MG capsule    Sig: Take 1 capsule (200 mg total) by mouth 3 (three) times daily as needed for cough.    Dispense:  30 capsule    Refill:  0  . azithromycin (ZITHROMAX) 250 MG tablet    Sig: 2 tabs po on day 1, 1 tab po on days 2-5    Dispense:  6 tablet    Refill:  0    *This clinic note was created using [employer]. Therefore, there may be occasional mistakes despite careful proofreading.  ?   Melynda Ripple, MD 09/09/18 0800

## 2018-09-08 NOTE — Discharge Instructions (Addendum)
Tessalon for the cough during the day, Tussionex for the cough at night.  2 puffs from your albuterol inhaler using your spacer 10 minutes before you go out and exercise.  You can also try using it for coughing, shortness of breath.  600 mg of ibuprofen combined with 1 g of Tylenol together 3 or 4 times a day as needed for headache, fever.  Follow-up with your doctor in 3 to 4 days.

## 2018-09-08 NOTE — ED Triage Notes (Signed)
Pt c/o not being able to warm up at night about 7:30 every night for the last week. She also gets a headache. She has sweats in the mornings and wakes up with a headache. She has been running a fever of 100.0-100.5. she has had body aches, a dry cough, and some congestion.

## 2018-09-09 ENCOUNTER — Other Ambulatory Visit: Payer: Self-pay | Admitting: Family Medicine

## 2018-09-09 ENCOUNTER — Encounter: Payer: Self-pay | Admitting: Family Medicine

## 2018-09-09 MED ORDER — SERTRALINE HCL 50 MG PO TABS: 50.0000 mg | ORAL_TABLET | Freq: Every day | ORAL | 6 refills | Status: DC

## 2018-09-11 ENCOUNTER — Telehealth (HOSPITAL_COMMUNITY): Payer: Self-pay | Admitting: Emergency Medicine

## 2018-09-11 NOTE — Telephone Encounter (Signed)
Attempted to reach patient about normal TSH. No answer at this time.

## 2018-09-13 ENCOUNTER — Ambulatory Visit: Payer: Self-pay | Admitting: Psychology

## 2018-09-27 ENCOUNTER — Ambulatory Visit (INDEPENDENT_AMBULATORY_CARE_PROVIDER_SITE_OTHER): Payer: 59 | Admitting: Psychology

## 2018-09-27 DIAGNOSIS — F411 Generalized anxiety disorder: Secondary | ICD-10-CM

## 2018-10-11 ENCOUNTER — Ambulatory Visit: Payer: 59 | Admitting: Psychology

## 2018-10-11 DIAGNOSIS — F411 Generalized anxiety disorder: Secondary | ICD-10-CM | POA: Diagnosis not present

## 2018-10-25 ENCOUNTER — Ambulatory Visit: Payer: 59 | Admitting: Psychology

## 2018-10-25 DIAGNOSIS — F411 Generalized anxiety disorder: Secondary | ICD-10-CM

## 2018-11-08 ENCOUNTER — Other Ambulatory Visit: Payer: Self-pay

## 2018-11-08 ENCOUNTER — Ambulatory Visit: Payer: 59 | Admitting: Family Medicine

## 2018-11-08 ENCOUNTER — Encounter: Payer: Self-pay | Admitting: Family Medicine

## 2018-11-08 ENCOUNTER — Ambulatory Visit: Payer: 59 | Admitting: Psychology

## 2018-11-08 VITALS — BP 110/68 | HR 79 | Temp 98.4°F | Resp 18 | Ht 68.5 in | Wt 235.2 lb

## 2018-11-08 DIAGNOSIS — D72820 Lymphocytosis (symptomatic): Secondary | ICD-10-CM

## 2018-11-08 DIAGNOSIS — F411 Generalized anxiety disorder: Secondary | ICD-10-CM

## 2018-11-08 DIAGNOSIS — J069 Acute upper respiratory infection, unspecified: Secondary | ICD-10-CM | POA: Insufficient documentation

## 2018-11-08 DIAGNOSIS — D72829 Elevated white blood cell count, unspecified: Secondary | ICD-10-CM | POA: Insufficient documentation

## 2018-11-08 LAB — CBC WITH DIFFERENTIAL/PLATELET
BASOS ABS: 0.1 10*3/uL (ref 0.0–0.1)
BASOS PCT: 0.8 % (ref 0.0–3.0)
EOS ABS: 0.2 10*3/uL (ref 0.0–0.7)
Eosinophils Relative: 2.4 % (ref 0.0–5.0)
HEMATOCRIT: 42.5 % (ref 36.0–46.0)
HEMOGLOBIN: 14.1 g/dL (ref 12.0–15.0)
LYMPHS PCT: 34.7 % (ref 12.0–46.0)
Lymphs Abs: 3.2 10*3/uL (ref 0.7–4.0)
MCHC: 33.2 g/dL (ref 30.0–36.0)
MCV: 83.5 fl (ref 78.0–100.0)
Monocytes Absolute: 0.7 10*3/uL (ref 0.1–1.0)
Monocytes Relative: 7.9 % (ref 3.0–12.0)
Neutro Abs: 4.9 10*3/uL (ref 1.4–7.7)
Neutrophils Relative %: 54.2 % (ref 43.0–77.0)
Platelets: 415 10*3/uL — ABNORMAL HIGH (ref 150.0–400.0)
RBC: 5.08 Mil/uL (ref 3.87–5.11)
RDW: 13.7 % (ref 11.5–15.5)
WBC: 9.1 10*3/uL (ref 4.0–10.5)

## 2018-11-08 MED ORDER — PREDNISONE 10 MG PO TABS: ORAL_TABLET | ORAL | 0 refills | Status: DC

## 2018-11-08 MED ORDER — MONTELUKAST SODIUM 10 MG PO TABS: 10.0000 mg | ORAL_TABLET | Freq: Every day | ORAL | 3 refills | Status: DC

## 2018-11-08 MED ORDER — ALBUTEROL SULFATE HFA 108 (90 BASE) MCG/ACT IN AERS: 2.0000 | INHALATION_SPRAY | Freq: Four times a day (QID) | RESPIRATORY_TRACT | 0 refills | Status: DC | PRN

## 2018-11-08 NOTE — Patient Instructions (Signed)
Great to see you. I will call you with your lab results from today and you can view them online.   Take prednisone a directed- in the morning and with food. Take singulair 10 mg daily.  Proair inhaler as needed.

## 2018-11-08 NOTE — Assessment & Plan Note (Signed)
Repeat CBC with diff today. The patient indicates understanding of these issues and agrees with the plan.

## 2018-11-08 NOTE — Progress Notes (Signed)
Subjective:   Patient ID: Holly Singleton, female    DOB: November 25, 1985, 33 y.o.   MRN: [id number]  Holly Singleton is a pleasant 33 y.o. year old female who presents to clinic today with Cough (productive, Denies fever, sob); nasal drainage; and Nasal Congestion  on 11/08/2018  HPI:  Here for persistent cough, nasal congestion, intermittently for past 2 months.   Was seen at [facility] on 09/08/18- notes reviewed.  At that time, she felt much worse- fevers, sinus pressure, cough, SOB, chest tightness. Neg CXR.  Flu neg.  BMET, TSH normal.   She did have atypical lymphocytosis- smear read as likely reactive.  She was given a zpack which did help for a couple of weeks.  And her symptoms are not as severe as they were but she still has a lot of drainage and a persistent dry cough.  Was prescribed albuterol inhaler but she never filled it.  Claritin and dayquil only helped a little.  She ran a 5 k this weekend and was not too SOB.  She did cough.  Not sure if she is allergic to something at work.  Symptoms do not seem much better on weekends.  No results found. EXAM: CHEST - 2 VIEW  COMPARISON:  None.  FINDINGS: Lungs are clear. Heart size and pulmonary vascularity are normal. No adenopathy. No bone lesions.  IMPRESSION: No edema or consolidation.   Electronically Signed   By: Lowella Grip III M.D.   On: 09/08/2018 12:26   Recent Results (from the past 2160 hour(s))  Rapid Influenza A&B Antigens ([facility] only)     Status: None   Collection Time: 09/08/18 10:44 AM  Result Value Ref Range   Influenza A ([facility]) NEGATIVE NEGATIVE   Influenza B ([facility]) NEGATIVE NEGATIVE    Comment: Performed at [facility], [address], [place], [place] [address]  CBC with Differential     Status: Abnormal   Collection Time: 09/08/18 11:51 AM  Result Value Ref Range   WBC 11.7 (H) 4.0 - 10.5 K/uL   RBC 4.67 3.87 - 5.11 MIL/uL   Hemoglobin 13.0 12.0 - 15.0 g/dL   HCT 39.7 36.0 -  46.0 %   MCV 85.0 80.0 - 100.0 fL   MCH 27.8 26.0 - 34.0 pg   MCHC 32.7 30.0 - 36.0 g/dL   RDW 14.1 11.5 - 15.5 %   Platelets 254 150 - 400 K/uL   nRBC 0.0 0.0 - 0.2 %   Neutrophils Relative % 33 %   Neutro Abs 4.0 1.7 - 7.7 K/uL   Band Neutrophils 1 %   Lymphocytes Relative 57 %   Lymphs Abs 6.7 (H) 0.7 - 4.0 K/uL   Monocytes Relative 5 %   Monocytes Absolute 0.6 0.1 - 1.0 K/uL   Eosinophils Relative 1 %   Eosinophils Absolute 0.1 0.0 - 0.5 K/uL   Basophils Relative 0 %   Basophils Absolute 0.0 0.0 - 0.1 K/uL   WBC Morphology Abnormal lymphocytes present    Metamyelocytes Relative 3 %   Abs Immature Granulocytes 0.40 (H) 0.00 - 0.07 K/uL    Comment: Performed at [facility], [address] [place], [place] [address]  Basic metabolic panel     Status: Abnormal   Collection Time: 09/08/18 11:51 AM  Result Value Ref Range   Sodium 139 135 - 145 mmol/L   Potassium 3.8 3.5 - 5.1 mmol/L   Chloride 106 98 - 111 mmol/L  CO2 24 22 - 32 mmol/L   Glucose, Bld 93 70 - 99 mg/dL   BUN 9 6 - 20 mg/dL   Creatinine, Ser 0.89 0.44 - 1.00 mg/dL   Calcium 8.3 (L) 8.9 - 10.3 mg/dL   GFR calc non Af Amer >60 >60 mL/min   GFR calc Af Amer >60 >60 mL/min   Anion gap 9 5 - 15    Comment: Performed at [facility], [address], [place], [place] [address]  TSH     Status: None   Collection Time: 09/08/18 11:51 AM  Result Value Ref Range   TSH 3.035 0.350 - 4.500 uIU/mL    Comment: Performed by a 3rd Generation assay with a functional sensitivity of <=0.01 uIU/mL. Performed at [facility], [address], [place], [place] [address]   Pathologist smear review     Status: None   Collection Time: 09/08/18 11:57 AM  Result Value Ref Range   Path Review Peripheral blood smear is reviewed.     Comment: Absolute lymphocytosis with numerous atypical lymphocytes, favor reactive. Normal platelet count and RBC indices. Reviewed by Kathi Simpers, M.D. Performed  at [facility], [address] [place], [place] [address]       Current Outpatient Medications on File Prior to Visit  Medication Sig Dispense Refill  . levothyroxine (SYNTHROID, LEVOTHROID) 112 MCG tablet TAKE 1 TABLET(112 MCG) BY MOUTH DAILY 90 tablet 2  . sertraline (ZOLOFT) 50 MG tablet Take 1 tablet (50 mg total) by mouth daily. 30 tablet 6   No current facility-administered medications on file prior to visit.     Allergies  Allergen Reactions  . Lidocaine Other (See Comments)    Severe burning    Past Medical History:  Diagnosis Date  . Anxiety   . Depression   . Hypothyroidism   . Morbid obesity ([place])     Past Surgical History:  Procedure Laterality Date  . APPENDECTOMY      Family History  Problem Relation Age of Onset  . Hypertension Mother   . Cancer Mother 47       lung CA  . Hypertension Father     Social History   Socioeconomic History  . Marital status: Married    Spouse name: Not on file  . Number of children: Not on file  . Years of education: Not on file  . Highest education level: Not on file  Occupational History  . 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 Financial resource strain: Not on file  . Food insecurity:    Worry: Not on file    Inability: Not on file  . Transportation needs:    Medical: Not on file    Non-medical: Not on file  Tobacco Use  . Smoking status: Never Smoker  . Smokeless tobacco: Never Used  Substance and Sexual Activity  . Alcohol use: Yes    Comment: occasionally  . Drug use: No  . Sexual activity: Yes  Lifestyle  . Physical activity:    Days per week: Not on file    Minutes per session: Not on file  . Stress: Not on file  Relationships  . Social connections:    Talks on phone: Not on file    Gets together: Not on file    Attends religious service: Not on file    Active member of club or organization: Not on file  Attends meetings of clubs  or organizations: Not on file    Relationship status: Not on file  . Intimate partner violence:    Fear of current or ex partner: Not on file    Emotionally abused: Not on file    Physically abused: Not on file    Forced sexual activity: Not on file  Other Topics Concern  . Not on file  Social History Narrative   Recently divorced.   The PMH, PSH, Social History, Family History, Medications, and allergies have been reviewed in [facility], and have been updated if relevant.   Review of Systems  Constitutional: Negative for fatigue and fever.  HENT: Positive for congestion, postnasal drip and rhinorrhea. Negative for ear discharge, ear pain, facial swelling, hearing loss, sinus pressure, sinus pain, sneezing, sore throat, tinnitus, trouble swallowing and voice change.   Eyes: Negative.   Respiratory: Positive for cough. Negative for apnea, choking, chest tightness, shortness of breath, wheezing and stridor.   Cardiovascular: Negative.   Gastrointestinal: Negative.   Endocrine: Negative.   Genitourinary: Negative.   Musculoskeletal: Negative.   Allergic/Immunologic: Positive for environmental allergies. Negative for food allergies and immunocompromised state.  Neurological: Negative.   Hematological: Negative.   All other systems reviewed and are negative.      Objective:    BP 110/68 (BP Location: Left Arm, Patient Position: Sitting, Cuff Size: Large)   Pulse 79   Temp 98.4 F (36.9 C) (Oral)   Resp 18   Ht 5' 8.5" (1.74 m)   Wt 235 lb 3.2 oz (106.7 kg)   LMP 11/04/2018   SpO2 98%   BMI 35.24 kg/m    Physical Exam Vitals signs and nursing note reviewed.  Constitutional:      General: She is not in acute distress.    Appearance: Normal appearance. She is not ill-appearing, toxic-appearing or diaphoretic.  HENT:     Head: Normocephalic and atraumatic.     Right Ear: Tympanic membrane normal.     Left Ear: Tympanic membrane normal.     Nose: Mucosal edema and rhinorrhea  present.     Right Turbinates: Swollen.     Left Turbinates: Swollen.     Right Sinus: No maxillary sinus tenderness or frontal sinus tenderness.     Left Sinus: No maxillary sinus tenderness or frontal sinus tenderness.  Neck:     Musculoskeletal: Normal range of motion.  Cardiovascular:     Rate and Rhythm: Normal rate and regular rhythm.  Pulmonary:     Effort: Pulmonary effort is normal. No respiratory distress.     Breath sounds: Normal breath sounds. No stridor. No wheezing, rhonchi or rales.  Chest:     Chest wall: No tenderness.  Musculoskeletal: Normal range of motion.        General: No swelling.  Skin:    General: Skin is warm and dry.  Neurological:     General: No focal deficit present.     Mental Status: She is alert and oriented to person, place, and time.  Psychiatric:        Mood and Affect: Mood normal.        Behavior: Behavior normal.        Thought Content: Thought content normal.        Judgment: Judgment normal.           Assessment & Plan:   Lymphocytosis - Plan: CBC with Differential/Platelet No follow-ups on file.

## 2018-11-08 NOTE — Assessment & Plan Note (Signed)
>  25 minutes spent in face to face time with patient, >50% spent in counselling or coordination of care She does seem to be improving but cough and congestion are lingering.  Exam reassuring.  ?RAD component.  Place on prednisone burst, singulair 10 mg daily. Proair as needed (although lungs clear today). Call or return to clinic prn if these symptoms worsen or fail to improve as anticipated. The patient indicates understanding of these issues and agrees with the plan.

## 2018-11-22 ENCOUNTER — Ambulatory Visit: Payer: 59 | Admitting: Psychology

## 2018-12-07 ENCOUNTER — Ambulatory Visit (INDEPENDENT_AMBULATORY_CARE_PROVIDER_SITE_OTHER): Payer: Commercial Managed Care - PPO | Admitting: Psychology

## 2018-12-07 DIAGNOSIS — F411 Generalized anxiety disorder: Secondary | ICD-10-CM | POA: Diagnosis not present

## 2018-12-19 ENCOUNTER — Ambulatory Visit: Payer: Self-pay | Admitting: Family Medicine

## 2018-12-20 ENCOUNTER — Ambulatory Visit (INDEPENDENT_AMBULATORY_CARE_PROVIDER_SITE_OTHER): Payer: Commercial Managed Care - PPO | Admitting: Psychology

## 2018-12-20 DIAGNOSIS — F411 Generalized anxiety disorder: Secondary | ICD-10-CM | POA: Diagnosis not present

## 2019-01-17 ENCOUNTER — Ambulatory Visit: Payer: 59 | Admitting: Psychology

## 2019-01-31 ENCOUNTER — Ambulatory Visit: Payer: 59 | Admitting: Psychology

## 2019-02-14 ENCOUNTER — Ambulatory Visit: Payer: 59 | Admitting: Psychology

## 2019-02-15 ENCOUNTER — Other Ambulatory Visit: Payer: Self-pay

## 2019-02-15 ENCOUNTER — Emergency Department
Admission: EM | Admit: 2019-02-15 | Discharge: 2019-02-15 | Disposition: A | Discharge status: Home / Self Care | Payer: Commercial Managed Care - PPO | Attending: Emergency Medicine | Admitting: Emergency Medicine

## 2019-02-15 ENCOUNTER — Encounter: Payer: Self-pay | Admitting: Emergency Medicine

## 2019-02-15 ENCOUNTER — Emergency Department: Payer: Commercial Managed Care - PPO

## 2019-02-15 DIAGNOSIS — K625 Hemorrhage of anus and rectum: Secondary | ICD-10-CM | POA: Diagnosis not present

## 2019-02-15 DIAGNOSIS — Z79899 Other long term (current) drug therapy: Secondary | ICD-10-CM | POA: Diagnosis not present

## 2019-02-15 DIAGNOSIS — E039 Hypothyroidism, unspecified: Secondary | ICD-10-CM | POA: Diagnosis not present

## 2019-02-15 DIAGNOSIS — R1013 Epigastric pain: Secondary | ICD-10-CM | POA: Diagnosis not present

## 2019-02-15 DIAGNOSIS — K449 Diaphragmatic hernia without obstruction or gangrene: Secondary | ICD-10-CM | POA: Insufficient documentation

## 2019-02-15 LAB — COMPREHENSIVE METABOLIC PANEL
ALT: 20 U/L (ref 0–44)
AST: 28 U/L (ref 15–41)
Albumin: 4 g/dL (ref 3.5–5.0)
Alkaline Phosphatase: 65 U/L (ref 38–126)
Anion gap: 9 (ref 5–15)
BUN: 17 mg/dL (ref 6–20)
CO2: 21 mmol/L — ABNORMAL LOW (ref 22–32)
Calcium: 8.7 mg/dL — ABNORMAL LOW (ref 8.9–10.3)
Chloride: 107 mmol/L (ref 98–111)
Creatinine, Ser: 0.88 mg/dL (ref 0.44–1.00)
GFR calc Af Amer: >60 mL/min (ref >60)
GFR calc non Af Amer: >60 mL/min (ref >60)
Glucose, Bld: 102 mg/dL — ABNORMAL HIGH (ref 70–99)
Potassium: 3.5 mmol/L (ref 3.5–5.1)
Sodium: 137 mmol/L (ref 135–145)
Total Bilirubin: 0.6 mg/dL (ref 0.3–1.2)
Total Protein: 7.7 g/dL (ref 6.5–8.1)

## 2019-02-15 LAB — CBC
HCT: 41.3 % (ref 36.0–46.0)
Hemoglobin: 14.2 g/dL (ref 12.0–15.0)
MCH: 29.4 pg (ref 26.0–34.0)
MCHC: 34.4 g/dL (ref 30.0–36.0)
MCV: 85.5 fL (ref 80.0–100.0)
Platelets: 404 10*3/uL — ABNORMAL HIGH (ref 150–400)
RBC: 4.83 MIL/uL (ref 3.87–5.11)
RDW: 12.8 % (ref 11.5–15.5)
WBC: 9 10*3/uL (ref 4.0–10.5)
nRBC: 0 % (ref 0.0–0.2)

## 2019-02-15 LAB — LIPASE, BLOOD: Lipase: 36 U/L (ref 11–51)

## 2019-02-15 LAB — TROPONIN I: Troponin I: <0.03 ng/mL (ref <0.03)

## 2019-02-15 MED ORDER — IOHEXOL 240 MG/ML SOLN
50.0000 mL | Freq: Once | INTRAMUSCULAR | Status: AC | PRN
  Administered 2019-02-15: 50 mL via ORAL

## 2019-02-15 MED ORDER — SUCRALFATE 1 GM/10ML PO SUSP: 1.0000 g | Freq: Four times a day (QID) | ORAL | 1 refills | Status: DC

## 2019-02-15 MED ORDER — PANTOPRAZOLE SODIUM 40 MG PO TBEC: 40.0000 mg | DELAYED_RELEASE_TABLET | Freq: Every day | ORAL | 0 refills | Status: DC

## 2019-02-15 MED ORDER — SODIUM CHLORIDE 0.9 % IV BOLUS
1000.0000 mL | Freq: Once | INTRAVENOUS | Status: AC
  Administered 2019-02-15: 1000 mL via INTRAVENOUS

## 2019-02-15 MED ORDER — IOHEXOL 300 MG/ML  SOLN
100.0000 mL | Freq: Once | INTRAMUSCULAR | Status: AC | PRN
  Administered 2019-02-15: 100 mL via INTRAVENOUS

## 2019-02-15 MED ORDER — PANTOPRAZOLE SODIUM 40 MG IV SOLR
40.0000 mg | Freq: Once | INTRAVENOUS | Status: AC
  Administered 2019-02-15: 40 mg via INTRAVENOUS
  Filled 2019-02-15: qty 40

## 2019-02-15 NOTE — ED Provider Notes (Signed)
[facility] Emergency Department Provider Note   ____________________________________________   First MD Initiated Contact with Patient 02/15/19 0154     (approximate)  I have reviewed the triage vital signs and the nursing notes.   HISTORY  Chief Complaint Rectal Bleeding    HPI Holly Singleton is a 33 y.o. female who presents to the ED from home with a chief complaint of epigastric burning and rectal bleeding.  Patient reports dark red blood mixed in with her stool yesterday afternoon while at work.  Later last evening patient was having severe burning to her epigastrium and refluxing to her throat.  Took Pepcid and Tums without relief of symptoms.  Had another bowel movement which was bloody.  Denies fever, cough, chest pain, shortness of breath, nausea, vomiting, dizziness.  Denies anticoagulant use.  Denies EtOH or excessive NSAID use.  Denies recent travel, trauma or exposure to persons diagnosed with coronavirus.       Past Medical History:  Diagnosis Date   Anxiety    Depression    Hypothyroidism    Morbid obesity [facility])     Patient Active Problem List   Diagnosis Date Noted   Leukocytosis 11/08/2018   Protracted upper respiratory infection 11/08/2018   Depression with anxiety 02/28/2018   Preeclampsia, third trimester 10/07/2017   Gestational HTN 10/07/2017   Positive pregnancy test 02/09/2017   Contraception management 06/05/2015   Heavy menses 04/19/2013   HLD (hyperlipidemia) 01/06/2010   NEVI, MULTIPLE 12/16/2009   Hypothyroidism 06/17/2007    Past Surgical History:  Procedure Laterality Date   APPENDECTOMY      Prior to Admission medications   Medication Sig Start Date End Date Taking? Authorizing Provider  albuterol (PROVENTIL HFA;VENTOLIN HFA) 108 (90 Base) MCG/ACT inhaler Inhale 2 puffs into the lungs every 6 (six) hours as needed. 11/08/18   [name], MD  levothyroxine (SYNTHROID, LEVOTHROID) 112 MCG  tablet TAKE 1 TABLET(112 MCG) BY MOUTH DAILY 08/30/18   [name], MD  montelukast (SINGULAIR) 10 MG tablet Take 1 tablet (10 mg total) by mouth at bedtime. 11/08/18   [name], MD  pantoprazole (PROTONIX) 40 MG tablet Take 1 tablet (40 mg total) by mouth daily for 30 days. 02/15/19 03/17/19  Paulette Blanch, MD  predniSONE (DELTASONE) 10 MG tablet 3 tabs by mouth x 3 days, 2 tabs by mouth x 2 days, 1 tab by mouth x 2 days and stop. 11/08/18   [name], MD  sertraline (ZOLOFT) 50 MG tablet Take 1 tablet (50 mg total) by mouth daily. 09/09/18   [name], MD  sucralfate (CARAFATE) 1 GM/10ML suspension Take 10 mLs (1 g total) by mouth 4 (four) times daily. 02/15/19   Paulette Blanch, MD    Allergies Lidocaine  Family History  Problem Relation Age of Onset   Hypertension Mother    Cancer Mother 81       lung CA   Hypertension Father     Social History Social History   Tobacco Use   Smoking status: Never Smoker   Smokeless tobacco: Never Used  Substance Use Topics   Alcohol use: Yes    Comment: occasionally   Drug use: No    Review of Systems  Constitutional: No fever/chills Eyes: No visual changes. ENT: No sore throat. Cardiovascular: Denies chest pain. Respiratory: Denies shortness of breath. Gastrointestinal: Positive for abdominal pain.  No nausea, no vomiting.  No diarrhea.  No constipation.  Positive  for rectal bleeding. Genitourinary: Negative for dysuria. Musculoskeletal: Negative for back pain. Skin: Negative for rash. Neurological: Negative for headaches, focal weakness or numbness.   ____________________________________________   PHYSICAL EXAM:  VITAL SIGNS: ED Triage Vitals  Enc Vitals Group     BP 02/15/19 0035 139/66     Pulse Rate 02/15/19 0035 70     Resp 02/15/19 0035 18     Temp 02/15/19 0035 98.2 F (36.8 C)     Temp Source 02/15/19 0035 Oral     SpO2 02/15/19 0035 96 %     Weight 02/15/19 0036 238 lb (108 kg)     Height  02/15/19 0036 5\' 8"  (1.727 m)     Head Circumference --      Peak Flow --      Pain Score 02/15/19 0035 5     Pain Loc --      Pain Edu? --      Excl. in [place]? --     Constitutional: Alert and oriented. Well appearing and in mild acute distress. Eyes: Conjunctivae are normal. PERRL. EOMI. Head: Atraumatic. Nose: No congestion/rhinnorhea. Mouth/Throat: Mucous membranes are moist.  Oropharynx non-erythematous. Neck: No stridor.   Cardiovascular: Normal rate, regular rhythm. Grossly normal heart sounds.  Good peripheral circulation. Respiratory: Normal respiratory effort.  No retractions. Lungs CTAB. Gastrointestinal: Soft and minimally tender to palpation epigastrium without rebound or guarding. No distention. No abdominal bruits. No CVA tenderness. Musculoskeletal: No lower extremity tenderness nor edema.  No joint effusions. Neurologic:  Normal speech and language. No gross focal neurologic deficits are appreciated. No gait instability. Skin:  Skin is warm, dry and intact. No rash noted. Psychiatric: Mood and affect are normal. Speech and behavior are normal.  ____________________________________________   LABS (all labs ordered are listed, but only abnormal results are displayed)  Labs Reviewed  COMPREHENSIVE METABOLIC PANEL - Abnormal; Notable for the following components:      Result Value   CO2 21 (*)    Glucose, Bld 102 (*)    Calcium 8.7 (*)    All other components within normal limits  CBC - Abnormal; Notable for the following components:   Platelets 404 (*)    All other components within normal limits  TROPONIN I  LIPASE, BLOOD  POC OCCULT BLOOD, ED  POC URINE PREG, ED   ____________________________________________  EKG  ED ECG REPORT I, Latesa Fratto J, the attending physician, personally viewed and interpreted this ECG.   Date: 02/15/2019  EKG Time: 0041  Rate: 60  Rhythm: normal EKG, normal sinus rhythm  Axis: Normal  Intervals:none  ST&T Change:  Nonspecific  ____________________________________________  RADIOLOGY  ED MD interpretation: Paraesophageal hernia  Official radiology report(s): Ct Abdomen Pelvis W Contrast  Result Date: 02/15/2019 CLINICAL DATA:  Rectal bleeding. The patient reports dark red blood mixed with stool. Upper abdominal burning. EXAM: CT ABDOMEN AND PELVIS WITH CONTRAST TECHNIQUE: Multidetector CT imaging of the abdomen and pelvis was performed using the standard protocol following bolus administration of intravenous contrast. CONTRAST:  132mL OMNIPAQUE IOHEXOL 300 MG/ML  SOLN COMPARISON:  Pelvic ultrasound 04/25/2013 FINDINGS: Lower chest: Minimal atelectasis is present. Lungs are otherwise clear. Heart size is normal. No significant pleural or pericardial effusion is present. Hepatobiliary: No focal liver abnormality is seen. No gallstones, gallbladder wall thickening, or biliary dilatation. Pancreas: Unremarkable. No pancreatic ductal dilatation or surrounding inflammatory changes. Spleen: Normal in size without focal abnormality. Adrenals/Urinary Tract: Adrenal glands are normal bilaterally. Kidneys and ureters are within normal limits.  There is no stone or mass lesion. There is no obstruction. The urinary bladder is within normal limits. Stomach/Bowel: A paraesophageal hernia is noted. Stomach is otherwise within normal limits. Duodenum is unremarkable. Small bowel is normal. Terminal ileum is within normal limits. The appendix is surgically absent. Ascending and transverse colon are within normal limits. Contrast extends to the mid transverse colon. Descending and sigmoid colon are normal. Vascular/Lymphatic: No significant vascular findings are present. No enlarged abdominal or pelvic lymph nodes. Reproductive: Uterus and bilateral adnexa are unremarkable. Other: No abdominal wall hernia or abnormality. No abdominopelvic ascites. Musculoskeletal: Vertebral body heights alignment are maintained. No focal lytic or  blastic lesions are present. Bony pelvis is within normal limits. Hips are located and normal. IMPRESSION: 1. No acute or focal lesion to explain rectal bleeding. 2. Paraesophageal hernia. Electronically Signed   By: San Morelle M.D.   On: 02/15/2019 05:05    ____________________________________________   PROCEDURES  Procedure(s) performed (including Critical Care):  Procedures  Rectal exam: External exam within normal limits without fissure or hemorrhoid.  No stool obtained on gloved finger.  Heme-negative.  ____________________________________________   INITIAL IMPRESSION / ASSESSMENT AND PLAN / ED COURSE  As part of my medical decision making, I reviewed the following data within the [place] notes reviewed and incorporated, Labs reviewed, EKG interpreted, Old chart reviewed, Radiograph reviewed and Notes from prior ED visits     MILANIE ROSENFIELD was evaluated in Emergency Department on 02/15/2019 for the symptoms described in the history of present illness. She was evaluated in the context of the global COVID-19 pandemic, which necessitated consideration that the patient might be at risk for infection with the SARS-CoV-2 virus that causes COVID-19. Institutional protocols and algorithms that pertain to the evaluation of patients at risk for COVID-19 are in a state of rapid change based on information released by regulatory bodies including the CDC and federal and state organizations. These policies and algorithms were followed during the patient's care in the ED.   33 year old female who presents with rectal bleeding and epigastric burning. Differential diagnosis includes, but is not limited to, biliary disease (biliary colic, acute cholecystitis, cholangitis, choledocholithiasis, etc), intrathoracic causes for epigastric abdominal pain including ACS, gastritis, duodenitis, pancreatitis, small bowel or large bowel obstruction, abdominal aortic aneurysm,  hernia, and ulcer(s).  H/H within normal limits.  Metabolic panel unremarkable.  Will administer IV Protonix and obtain CT abdomen/pelvis to evaluate etiology of patient's symptoms.  Clinical Course as of Feb 14 554  Wed Feb 15, 2019  0512 Updated patient on CT result.  Will discharge home on Carafate and refer to GI for outpatient follow-up. Strict return precautions given. Patient verbalizes understanding and agrees with plan of care.   [JS]    Clinical Course User Index [JS] Paulette Blanch, MD     ____________________________________________   FINAL CLINICAL IMPRESSION(S) / ED DIAGNOSES  Final diagnoses:  Rectal bleeding  Epigastric pain  Paraesophageal hernia     ED Discharge Orders         Ordered    sucralfate (CARAFATE) 1 GM/10ML suspension  4 times daily     02/15/19 0515    pantoprazole (PROTONIX) 40 MG tablet  Daily     02/15/19 0515           Note:  This document was prepared using Dragon voice recognition software and may include unintentional dictation errors.   Paulette Blanch, MD 02/15/19 [phone]

## 2019-02-15 NOTE — ED Triage Notes (Addendum)
Pt presents to ED with dark red blood mixed in with her stool. Hx of the same while pregnant due to hemorrhoids. Pt also reports having severe burning in her chest from the bottom her ribs to the bottom of her throat. Has taken Pepcid and Tums with no relief. Pt reports frequent heartburn but usually treated easily at home. Onset of symptoms around 2315.

## 2019-02-15 NOTE — Discharge Instructions (Signed)
1.  Start Carafate as prescribed. 2.  Take Protonix 40 mg daily. 3.  Bland diet x1 week, then slowly advance diet as tolerated. 4.  Return to the ER for worsening symptoms, persistent vomiting, difficulty breathing or other concerns.

## 2019-02-28 ENCOUNTER — Ambulatory Visit: Payer: 59 | Admitting: Psychology

## 2019-03-10 ENCOUNTER — Ambulatory Visit (INDEPENDENT_AMBULATORY_CARE_PROVIDER_SITE_OTHER): Payer: 59 | Admitting: Psychology

## 2019-03-10 DIAGNOSIS — F411 Generalized anxiety disorder: Secondary | ICD-10-CM | POA: Diagnosis not present

## 2019-03-14 ENCOUNTER — Ambulatory Visit (INDEPENDENT_AMBULATORY_CARE_PROVIDER_SITE_OTHER): Payer: 59 | Admitting: Psychology

## 2019-03-14 DIAGNOSIS — F411 Generalized anxiety disorder: Secondary | ICD-10-CM

## 2019-03-16 DIAGNOSIS — Z0000 Encounter for general adult medical examination without abnormal findings: Secondary | ICD-10-CM | POA: Insufficient documentation

## 2019-03-16 DIAGNOSIS — Z0001 Encounter for general adult medical examination with abnormal findings: Secondary | ICD-10-CM | POA: Insufficient documentation

## 2019-03-16 NOTE — Progress Notes (Signed)
Subjective:   Patient ID: Holly Singleton, female    DOB: 16-Nov-1985, 33 y.o.   MRN: [id number]  Holly Singleton is a pleasant 33 y.o. year old female who presents to clinic today with Annual Exam (Pt screened in the vehicle. Pt is here today for a CPE without PAP. Her GYN is Kathryne Eriksson, MD.  She is currently fasting.  She is UTD on Immunizations.)  on 03/20/2019  HPI:  Health Maintenance  Topic Date Due  . INFLUENZA VACCINE  04/01/2019  . PAP SMEAR-Modifier  03/16/2020  . TETANUS/TDAP  08/24/2023  . HIV Screening  Completed   Has GYN, Dr. Julien Girt.   Son is doing well- still in daycare.  Started new job in 12/2018- [employer]. 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 She does like her new job.  Depression- taking zoloft 50 mg daily. Still seeing Rodena Piety for therapy every other week and she feels it is helpful.   She feels she is coping well.  Depression screen [facility] 2/9 03/20/2019 02/28/2018 12/14/2017 06/05/2015  Decreased Interest 0 0 0 0  Down, Depressed, Hopeless 0 0 0 0  PHQ - 2 Score 0 0 0 0  Altered sleeping - 0 - -  Tired, decreased energy - 2 - -  Change in appetite - 0 - -  Feeling bad or failure about yourself  - 1 - -  Trouble concentrating - 0 - -  Moving slowly or fidgety/restless - 0 - -  Suicidal thoughts - 0 - -  PHQ-9 Score - 3 - -  Difficult doing work/chores - Not difficult at all - -   GAD 7 : Generalized Anxiety Score 02/28/2018  Nervous, Anxious, on Edge 0  Control/stop worrying 0  Worry too much - different things 1  Trouble relaxing 0  Restless 0  Easily annoyed or irritable 0  Afraid - awful might happen 0  Total GAD 7 Score 1  Anxiety Difficulty Not difficult at all      Hypothyroidism- Currently taking synthroid 125 mcg daily.  Lab Results  Component Value Date   TSH 3.035 09/08/2018    Lab Results  Component Value Date   CHOL 217 (H) 12/14/2017   HDL 55.40 12/14/2017   LDLCALC 135 (H) 12/14/2017   LDLDIRECT 162.9 07/06/2007   TRIG 130.0 12/14/2017   CHOLHDL 4 12/14/2017     Current Outpatient Medications on File Prior to Visit  Medication Sig Dispense Refill  . sertraline (ZOLOFT) 50 MG tablet Take 1 tablet (50 mg total) by mouth daily. 30 tablet 6  . albuterol (PROVENTIL HFA;VENTOLIN HFA) 108 (90 Base) MCG/ACT inhaler Inhale 2 puffs into the lungs every 6 (six) hours as needed. (Patient not taking: Reported on 03/20/2019) 1 Inhaler 0  . pantoprazole (PROTONIX) 40 MG tablet Take 1 tablet (40 mg total) by mouth daily for 30 days. (Patient not taking: Reported on 03/20/2019) 30 tablet 0   No current facility-administered medications on file prior to visit.     Allergies  Allergen Reactions  . Lidocaine Other (See Comments)    Severe burning    Past Medical History:  Diagnosis Date  . Anxiety   . Depression   . Hypothyroidism   . Morbid obesity ([place])     Past Surgical History:  Procedure Laterality Date  . APPENDECTOMY      Family History  Problem Relation Age of Onset  . Hypertension Mother   . Cancer Mother 77  lung CA  . Hypertension Father     Social History   Socioeconomic History  . Marital status: Married    Spouse name: Not on file  . Number of children: Not on file  . Years of education: Not on file  . Highest education level: Not on file  Occupational History  . 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 Financial resource strain: Not on file  . Food insecurity    Worry: Not on file    Inability: Not on file  . Transportation needs    Medical: Not on file    Non-medical: Not on file  Tobacco Use  . Smoking status: Never Smoker  . Smokeless tobacco: Never Used  Substance and Sexual Activity  . Alcohol use: Yes    Comment: occasionally  . Drug use: No  . Sexual activity: Yes  Lifestyle  . Physical activity    Days per week: Not on file    Minutes per session: Not on file  . Stress: Not on  file  Relationships  . Social Herbalist on phone: Not on file    Gets together: Not on file    Attends religious service: Not on file    Active member of club or organization: Not on file    Attends meetings of clubs or organizations: Not on file    Relationship status: Not on file  . Intimate partner violence    Fear of current or ex partner: Not on file    Emotionally abused: Not on file    Physically abused: Not on file    Forced sexual activity: Not on file  Other Topics Concern  . Not on file  Social History Narrative   Recently divorced.   The PMH, PSH, Social History, Family History, Medications, and allergies have been reviewed in [facility], and have been updated if relevant.   Review of Systems  Constitutional: Negative.   HENT: Negative.   Eyes: Negative.   Respiratory: Negative.   Cardiovascular: Negative.   Gastrointestinal: Negative.   Endocrine: Negative.   Genitourinary: Negative.   Musculoskeletal: Negative.   Skin: Negative.   Allergic/Immunologic: Negative.   Neurological: Negative.   Hematological: Negative.   Psychiatric/Behavioral: Negative.   All other systems reviewed and are negative.      Objective:    BP 114/68 (BP Location: Left Arm, Patient Position: Sitting, Cuff Size: Normal)   Pulse 70   Temp 98.5 F (36.9 C) (Oral)   Ht 5' 8.5" (1.74 m)   Wt 242 lb 3.2 oz (109.9 kg)   LMP 03/03/2019   SpO2 97%   Breastfeeding No   BMI 36.29 kg/m   Wt Readings from Last 3 Encounters:  03/20/19 242 lb 3.2 oz (109.9 kg)  02/15/19 238 lb (108 kg)  11/08/18 235 lb 3.2 oz (106.7 kg)     General:  Well-developed,well-nourished,in no acute distress; alert,appropriate and cooperative throughout examination Head:  normocephalic and atraumatic.   Eyes:  vision grossly intact, PERRL Ears:  R ear normal and L ear normal externally, TMs clear bilaterally Nose:  no external deformity.   Mouth:  good dentition.   Neck:  No deformities, masses,  or tenderness noted. Lungs:  Normal respiratory effort, chest expands symmetrically. Lungs are clear to auscultation, no crackles or wheezes. Heart:  Normal rate and regular rhythm. S1 and S2 normal without gallop, murmur, click, rub or other extra sounds.  Abdomen:  Bowel sounds positive,abdomen soft and non-tender without masses, organomegaly or hernias noted. Msk:  No deformity or scoliosis noted of thoracic or lumbar spine.   Extremities:  No clubbing, cyanosis, edema, or deformity noted with normal full range of motion of all joints.   Neurologic:  alert & oriented X3 and gait normal.   Skin:  Intact without suspicious lesions or rashes Cervical Nodes:  No lymphadenopathy noted Axillary Nodes:  No palpable lymphadenopathy Psych:  Cognition and judgment appear intact. Alert and cooperative with normal attention span and concentration. No apparent delusions, illusions, hallucinations        Assessment & Plan:   Well woman exam without gynecological exam - Plan: Comp Met (CMET), CBC w/Diff, VITAMIN D 25 Hydroxy (Vit-D Deficiency, Fractures), Lipid Profile, TSH, T4, free, T3,  Depression with anxiety - Plan: Comp Met (CMET), VITAMIN D 25 Hydroxy (Vit-D Deficiency, Fractures), TSH, T4, free, T3,   Hyperlipidemia, unspecified hyperlipidemia type - Plan: Comp Met (CMET), CBC w/Diff, Lipid Profile,   Hypothyroidism, unspecified type - Plan: Comp Met (CMET), Lipid Profile, TSH, T4, free, T3,   Encounter for other contraceptive management - Plan:   Hypocalcemia - Plan: Comp Met (CMET), CBC w/Diff, VITAMIN D 25 Hydroxy (Vit-D Deficiency, Fractures),  No follow-ups on file.

## 2019-03-20 ENCOUNTER — Ambulatory Visit (INDEPENDENT_AMBULATORY_CARE_PROVIDER_SITE_OTHER): Payer: Managed Care, Other (non HMO) | Admitting: Family Medicine

## 2019-03-20 ENCOUNTER — Encounter: Payer: Self-pay | Admitting: Family Medicine

## 2019-03-20 ENCOUNTER — Other Ambulatory Visit: Payer: Self-pay

## 2019-03-20 VITALS — BP 114/68 | HR 70 | Temp 98.5°F | Ht 68.5 in | Wt 242.2 lb

## 2019-03-20 DIAGNOSIS — E039 Hypothyroidism, unspecified: Secondary | ICD-10-CM | POA: Diagnosis not present

## 2019-03-20 DIAGNOSIS — E785 Hyperlipidemia, unspecified: Secondary | ICD-10-CM

## 2019-03-20 DIAGNOSIS — K219 Gastro-esophageal reflux disease without esophagitis: Secondary | ICD-10-CM

## 2019-03-20 DIAGNOSIS — Z308 Encounter for other contraceptive management: Secondary | ICD-10-CM

## 2019-03-20 DIAGNOSIS — Z0000 Encounter for general adult medical examination without abnormal findings: Secondary | ICD-10-CM

## 2019-03-20 DIAGNOSIS — F418 Other specified anxiety disorders: Secondary | ICD-10-CM

## 2019-03-20 MED ORDER — LEVOTHYROXINE SODIUM 112 MCG PO TABS: ORAL_TABLET | ORAL | 3 refills | Status: DC

## 2019-03-20 NOTE — Patient Instructions (Addendum)
Great to see you. I will call you with your lab results from today and you can view them online.   Happy birthday!!!  Upload your wellness form.   Food Choices for Gastroesophageal Reflux Disease, Adult When you have gastroesophageal reflux disease (GERD), the foods you eat and your eating habits are very important. Choosing the right foods can help ease your discomfort. Think about working with a nutrition specialist (dietitian) to help you make good choices. What are tips for following this plan?  Meals  Choose healthy foods that are low in fat, such as fruits, vegetables, whole grains, low-fat dairy products, and lean meat, fish, and poultry.  Eat small meals often instead of 3 large meals a day. Eat your meals slowly, and in a place where you are relaxed. Avoid bending over or lying down until 2-3 hours after eating.  Avoid eating meals 2-3 hours before bed.  Avoid drinking a lot of liquid with meals.  Cook foods using methods other than frying. Bake, grill, or broil food instead.  Avoid or limit: ? Chocolate. ? Peppermint or spearmint. ? Alcohol. ? Pepper. ? Black and decaffeinated coffee. ? Black and decaffeinated tea. ? Bubbly (carbonated) soft drinks. ? Caffeinated energy drinks and soft drinks.  Limit high-fat foods such as: ? Fatty meat or fried foods. ? Whole milk, cream, butter, or ice cream. ? Nuts and nut butters. ? Pastries, donuts, and sweets made with butter or shortening.  Avoid foods that cause symptoms. These foods may be different for everyone. Common foods that cause symptoms include: ? Tomatoes. ? Oranges, lemons, and limes. ? Peppers. ? Spicy food. ? Onions and garlic. ? Vinegar. Lifestyle  Maintain a healthy weight. Ask your doctor what weight is healthy for you. If you need to lose weight, work with your doctor to do so safely.  Exercise for at least 30 minutes for 5 or more days each week, or as told by your doctor.  Wear loose-fitting  clothes.  Do not smoke. If you need help quitting, ask your doctor.  Sleep with the head of your bed higher than your feet. Use a wedge under the mattress or blocks under the bed frame to raise the head of the bed. Summary  When you have gastroesophageal reflux disease (GERD), food and lifestyle choices are very important in easing your symptoms.  Eat small meals often instead of 3 large meals a day. Eat your meals slowly, and in a place where you are relaxed.  Limit high-fat foods such as fatty meat or fried foods.  Avoid bending over or lying down until 2-3 hours after eating.  Avoid peppermint and spearmint, caffeine, alcohol, and chocolate. This information is not intended to replace advice given to you by your health care provider. Make sure you discuss any questions you have with your health care provider. Document Released: 02/16/2012 Document Revised: 12/08/2018 Document Reviewed: 09/22/2016 Elsevier Patient Education  2020 [employer].

## 2019-03-20 NOTE — Addendum Note (Signed)
Addended by: [name] on: 03/20/2019 08:50 AM   Modules accepted: Orders

## 2019-03-20 NOTE — Assessment & Plan Note (Signed)
Doing well on zoloft 50 mg daily.

## 2019-03-20 NOTE — Assessment & Plan Note (Signed)
Reviewed preventive care protocols, scheduled due services, and updated immunizations Discussed nutrition, exercise, diet, and healthy lifestyle.  

## 2019-03-20 NOTE — Assessment & Plan Note (Signed)
Clinically euthyroid. Due for labs. Orders Placed This Encounter  Procedures  . Comp Met (CMET)  . CBC w/Diff  . VITAMIN D 25 Hydroxy (Vit-D Deficiency, Fractures)  . Lipid Profile  . TSH  . T4, free  . T3

## 2019-03-21 LAB — CBC WITH DIFFERENTIAL/PLATELET
Basophils Absolute: 0.1 10*3/uL (ref 0.0–0.2)
Basos: 1 %
EOS (ABSOLUTE): 0.3 10*3/uL (ref 0.0–0.4)
Eos: 5 %
Hematocrit: 39.7 % (ref 34.0–46.6)
Hemoglobin: 13.8 g/dL (ref 11.1–15.9)
Immature Grans (Abs): 0 10*3/uL (ref 0.0–0.1)
Immature Granulocytes: 0 %
Lymphocytes Absolute: 2.2 10*3/uL (ref 0.7–3.1)
Lymphs: 34 %
MCH: 30.5 pg (ref 26.6–33.0)
MCHC: 34.8 g/dL (ref 31.5–35.7)
MCV: 88 fL (ref 79–97)
Monocytes Absolute: 0.5 10*3/uL (ref 0.1–0.9)
Monocytes: 8 %
Neutrophils Absolute: 3.3 10*3/uL (ref 1.4–7.0)
Neutrophils: 52 %
Platelets: 344 10*3/uL (ref 150–450)
RBC: 4.53 x10E6/uL (ref 3.77–5.28)
RDW: 12.4 % (ref 11.7–15.4)
WBC: 6.4 10*3/uL (ref 3.4–10.8)

## 2019-03-21 LAB — COMPREHENSIVE METABOLIC PANEL
ALT: 20 IU/L (ref 0–32)
AST: 25 IU/L (ref 0–40)
Albumin/Globulin Ratio: 1.6 (ref 1.2–2.2)
Albumin: 4.1 g/dL (ref 3.8–4.8)
Alkaline Phosphatase: 61 IU/L (ref 39–117)
BUN/Creatinine Ratio: 12 (ref 9–23)
BUN: 9 mg/dL (ref 6–20)
Bilirubin Total: 0.4 mg/dL (ref 0.0–1.2)
CO2: 19 mmol/L — ABNORMAL LOW (ref 20–29)
Calcium: 9.1 mg/dL (ref 8.7–10.2)
Chloride: 105 mmol/L (ref 96–106)
Creatinine, Ser: 0.74 mg/dL (ref 0.57–1.00)
GFR calc Af Amer: 122 mL/min/{1.73_m2} (ref >59)
GFR calc non Af Amer: 106 mL/min/{1.73_m2} (ref >59)
Globulin, Total: 2.5 g/dL (ref 1.5–4.5)
Glucose: 93 mg/dL (ref 65–99)
Potassium: 4.9 mmol/L (ref 3.5–5.2)
Sodium: 142 mmol/L (ref 134–144)
Total Protein: 6.6 g/dL (ref 6.0–8.5)

## 2019-03-21 LAB — LIPID PANEL
Chol/HDL Ratio: 4.5 ratio — ABNORMAL HIGH (ref 0.0–4.4)
Cholesterol, Total: 198 mg/dL (ref 100–199)
HDL: 44 mg/dL (ref >39)
LDL Calculated: 131 mg/dL — ABNORMAL HIGH (ref 0–99)
Triglycerides: 114 mg/dL (ref 0–149)
VLDL Cholesterol Cal: 23 mg/dL (ref 5–40)

## 2019-03-21 LAB — TSH: TSH: 2.77 u[IU]/mL (ref 0.450–4.500)

## 2019-03-21 LAB — VITAMIN D 25 HYDROXY (VIT D DEFICIENCY, FRACTURES): Vit D, 25-Hydroxy: 27.3 ng/mL — ABNORMAL LOW (ref 30.0–100.0)

## 2019-03-21 LAB — T4, FREE: Free T4: 1.14 ng/dL (ref 0.82–1.77)

## 2019-03-21 LAB — T3: T3, Total: 114 ng/dL (ref 71–180)

## 2019-03-22 LAB — H. PYLORI BREATH TEST

## 2019-03-22 LAB — H.PYLORI BREATH TEST (REFLEX): H. pylori Breath Test: NEGATIVE

## 2019-03-28 ENCOUNTER — Ambulatory Visit (INDEPENDENT_AMBULATORY_CARE_PROVIDER_SITE_OTHER): Payer: 59 | Admitting: Psychology

## 2019-03-28 DIAGNOSIS — F411 Generalized anxiety disorder: Secondary | ICD-10-CM

## 2019-04-11 ENCOUNTER — Ambulatory Visit: Payer: 59 | Admitting: Psychology

## 2019-04-25 ENCOUNTER — Ambulatory Visit (INDEPENDENT_AMBULATORY_CARE_PROVIDER_SITE_OTHER): Payer: 59 | Admitting: Psychology

## 2019-04-25 DIAGNOSIS — F411 Generalized anxiety disorder: Secondary | ICD-10-CM | POA: Diagnosis not present

## 2019-05-09 ENCOUNTER — Encounter: Payer: Self-pay | Admitting: Family Medicine

## 2019-05-09 ENCOUNTER — Ambulatory Visit: Payer: 59 | Admitting: Psychology

## 2019-05-10 NOTE — Progress Notes (Signed)
Virtual Visit via Video   Due to the COVID-19 pandemic, this visit was completed with telemedicine (audio/video) technology to reduce patient and provider exposure as well as to preserve personal protective equipment.   I connected with Holly Singleton by a video enabled telemedicine application and verified that I am speaking with the correct person using two identifiers. Location patient: Home Location provider: [facility], Office Persons participating in the virtual visit: Leslie Dales, MD   I discussed the limitations of evaluation and management by telemedicine and the availability of in person appointments. The patient expressed understanding and agreed to proceed.  Care Team   Patient Care Team: [name], MD as PCP - General (Family Medicine)  Subjective:   HPI:   Patient sent the following message two days ago:  "Hi Dr. [name], I wasn't sure if I needed to make an appointment for this, because it's not super urgent. But I started developing stuffiness and a dry cough about 2 weeks ago that I at first attributed to late summer allergies (which I usually get every year around now). The cough has gotten a bit worse, resulting in situations where my throat itches terribly and I can't stop coughing for maybe 5-20 seconds at a time, a few times per day. The stuffiness has gotten a little worse, but resembles more of a cold. I can feel crackling in my lungs now when I breathe in (this started 2 or 3 days ago). I think we are fairly low-risk for covid because we don't go out to eat and only leave home for work, exercise, and errands. Plus, my son was tested for covid (came back negative) last week because he developed a similar cough and daycare made [place] take him out until his symptoms were completely gone. Do I need to come in to be looked at? Is there anything we can do?  Thank you! Holly Singleton"  She is having HAs in the morning and mild DOE.  Dayquil helps during the  day.  Tried mucinex but wasn't helpful.  Review of Systems  Constitutional: Negative for chills, fever, malaise/fatigue and weight loss.  HENT: Positive for congestion and sinus pain.   Eyes: Negative.   Respiratory: Positive for cough.   Cardiovascular: Negative.   Gastrointestinal: Negative.   Musculoskeletal: Negative.   Skin: Negative.   Neurological: Positive for headaches. Negative for dizziness, tingling, tremors, sensory change, speech change, focal weakness, seizures, loss of consciousness and weakness.  Endo/Heme/Allergies: Negative.   Psychiatric/Behavioral: Negative.   All other systems reviewed and are negative.    Patient Active Problem List   Diagnosis Date Noted  . Well woman exam without gynecological exam 03/16/2019  . Leukocytosis 11/08/2018  . Depression with anxiety 02/28/2018  . Preeclampsia, third trimester 10/07/2017  . Gestational HTN 10/07/2017  . Contraception management 06/05/2015  . Heavy menses 04/19/2013  . HLD (hyperlipidemia) 01/06/2010  . NEVI, MULTIPLE 12/16/2009  . Hypothyroidism 06/17/2007    Social History   Tobacco Use  . Smoking status: Never Smoker  . Smokeless tobacco: Never Used  Substance Use Topics  . Alcohol use: Yes    Comment: occasionally    Current Outpatient Medications:  .  levothyroxine (SYNTHROID) 112 MCG tablet, TAKE 1 TABLET(112 MCG) BY MOUTH DAILY, Disp: 90 tablet, Rfl: 3 .  sertraline (ZOLOFT) 50 MG tablet, Take 1 tablet (50 mg total) by mouth daily., Disp: 30 tablet, Rfl: 6  Allergies  Allergen Reactions  . Lidocaine Other (See  Comments)    Severe burning    Objective:  There were no vitals taken for this visit.  VITALS: Per patient if applicable, see vitals. GENERAL: Alert, appears well and in no acute distress. HEENT: Atraumatic, conjunctiva clear, no obvious abnormalities on inspection of external nose and ears. NECK: Normal movements of the head and neck. CARDIOPULMONARY: No increased WOB. Speaking  in clear sentences. I:E ratio WNL.  MS: Moves all visible extremities without noticeable abnormality. PSYCH: Pleasant and cooperative, well-groomed. Speech normal rate and rhythm. Affect is appropriate. Insight and judgement are appropriate. Attention is focused, linear, and appropriate.  NEURO: CN grossly intact. Oriented as arrived to appointment on time with no prompting. Moves both UE equally.  SKIN: No obvious lesions, wounds, erythema, or cyanosis noted on face or hands.  Depression screen [facility] 2/9 03/20/2019 02/28/2018 12/14/2017  Decreased Interest 0 0 0  Down, Depressed, Hopeless 0 0 0  PHQ - 2 Score 0 0 0  Altered sleeping - 0 -  Tired, decreased energy - 2 -  Change in appetite - 0 -  Feeling bad or failure about yourself  - 1 -  Trouble concentrating - 0 -  Moving slowly or fidgety/restless - 0 -  Suicidal thoughts - 0 -  PHQ-9 Score - 3 -  Difficult doing work/chores - Not difficult at all -    Assessment and Plan:   There are no diagnoses linked to this encounter.  [name] COVID-19 Education: The signs and symptoms of COVID-19 were discussed with the patient and how to seek care for testing if needed. The importance of social distancing was discussed today. . Reviewed expectations re: course of current medical issues. . Discussed self-management of symptoms. . Outlined signs and symptoms indicating need for more acute intervention. . Patient verbalized understanding and all questions were answered. [name] Health Maintenance issues including appropriate healthy diet, exercise, and smoking avoidance were discussed with patient. . See orders for this visit as documented in the electronic medical record.  [name], MD  Records requested if needed. Time spent: 15 minutes, of which >50% was spent in obtaining information about her symptoms, reviewing her previous labs, evaluations, and treatments, counseling her about her condition (please see the discussed topics above), and developing  a plan to further investigate it; she had a number of questions which I addressed.

## 2019-05-11 ENCOUNTER — Encounter: Payer: Self-pay | Admitting: Family Medicine

## 2019-05-11 ENCOUNTER — Other Ambulatory Visit: Payer: Self-pay | Admitting: Family Medicine

## 2019-05-11 ENCOUNTER — Ambulatory Visit (INDEPENDENT_AMBULATORY_CARE_PROVIDER_SITE_OTHER): Payer: Managed Care, Other (non HMO) | Admitting: Family Medicine

## 2019-05-11 DIAGNOSIS — J069 Acute upper respiratory infection, unspecified: Secondary | ICD-10-CM

## 2019-05-11 MED ORDER — FLUTICASONE PROPIONATE 50 MCG/ACT NA SUSP: 2.0000 | Freq: Every day | NASAL | 6 refills | Status: DC

## 2019-05-11 MED ORDER — DOXYCYCLINE HYCLATE 100 MG PO TABS: 100.0000 mg | ORAL_TABLET | Freq: Two times a day (BID) | ORAL | 0 refills | Status: DC

## 2019-05-11 MED ORDER — ALBUTEROL SULFATE HFA 108 (90 BASE) MCG/ACT IN AERS: 2.0000 | INHALATION_SPRAY | Freq: Four times a day (QID) | RESPIRATORY_TRACT | 0 refills | Status: DC | PRN

## 2019-05-11 NOTE — Assessment & Plan Note (Signed)
Given duration and progression of symptoms, will treat for bacterial sinusitis and acute bacterial bronchitis/PNA.  Cough is more productive, wheezing more, with increased sinus pressure over two and half weeks. Treat with doxycyline, flonase and as needed proair. Call or send my chart message prn if these symptoms worsen or fail to improve as anticipated. The patient indicates understanding of these issues and agrees with the plan.

## 2019-05-23 ENCOUNTER — Ambulatory Visit (INDEPENDENT_AMBULATORY_CARE_PROVIDER_SITE_OTHER): Payer: 59 | Admitting: Psychology

## 2019-05-23 DIAGNOSIS — F411 Generalized anxiety disorder: Secondary | ICD-10-CM

## 2019-06-06 ENCOUNTER — Other Ambulatory Visit: Payer: Self-pay

## 2019-06-06 ENCOUNTER — Encounter: Payer: Self-pay | Admitting: Family Medicine

## 2019-06-06 ENCOUNTER — Ambulatory Visit: Payer: 59 | Admitting: Psychology

## 2019-06-06 ENCOUNTER — Ambulatory Visit
Admission: EM | Admit: 2019-06-06 | Discharge: 2019-06-06 | Disposition: A | Discharge status: Home / Self Care | Payer: Managed Care, Other (non HMO) | Attending: Internal Medicine | Admitting: Internal Medicine

## 2019-06-06 ENCOUNTER — Encounter: Payer: Self-pay | Admitting: Emergency Medicine

## 2019-06-06 DIAGNOSIS — J029 Acute pharyngitis, unspecified: Secondary | ICD-10-CM | POA: Diagnosis not present

## 2019-06-06 LAB — RAPID STREP SCREEN (MED CTR MEBANE ONLY): Streptococcus, Group A Screen (Direct): NEGATIVE

## 2019-06-06 MED ORDER — SERTRALINE HCL 50 MG PO TABS: 50.0000 mg | ORAL_TABLET | Freq: Every day | ORAL | 1 refills | Status: DC

## 2019-06-06 MED ORDER — ACETAMINOPHEN 500 MG PO TABS: 500.0000 mg | ORAL_TABLET | Freq: Four times a day (QID) | ORAL | 0 refills | Status: DC | PRN

## 2019-06-06 NOTE — ED Triage Notes (Signed)
Pt c/o sore throat, and "spots"/blisters on bilateral hands. Started about 2 days ago. She states that her son was diagnosed with hand foot and mouth this past week.

## 2019-06-06 NOTE — ED Provider Notes (Signed)
[facility]    CSN: [id number] Arrival date & time: 06/06/19  1820      History   Chief Complaint Chief Complaint  Patient presents with  . Sore Throat    HPI Holly Singleton is a 33 y.o. female with history of hypothyroidism-controlled, depression-controlled comes to urgent care with complaints of worsening sore throat, mouth sores and blisters on the hands bilaterally.  Symptoms started 2 days ago and is gotten progressively worse.  Patient has not tried any over-the-counter medications.  His son had hand-foot-and-mouth syndrome about a week ago and has since recovered from that.  She denies any cough or sputum production.  No runny nose or sneezing.  No wheezing.[name]   HPI  Past Medical History:  Diagnosis Date  . Anxiety   . Depression   . Hypothyroidism   . Morbid obesity [facility])     Patient Active Problem List   Diagnosis Date Noted  . Upper respiratory infection 05/11/2019  . Well woman exam without gynecological exam 03/16/2019  . Leukocytosis 11/08/2018  . Depression with anxiety 02/28/2018  . Preeclampsia, third trimester 10/07/2017  . Gestational HTN 10/07/2017  . Contraception management 06/05/2015  . Heavy menses 04/19/2013  . HLD (hyperlipidemia) 01/06/2010  . NEVI, MULTIPLE 12/16/2009  . Hypothyroidism 06/17/2007    Past Surgical History:  Procedure Laterality Date  . APPENDECTOMY      OB History    Gravida  1   Para  1   Term  1   Preterm  0   AB  0   Living  1     SAB  0   TAB  0   Ectopic  0   Multiple  0   Live Births  1            Home Medications    Prior to Admission medications   Medication Sig Start Date End Date Taking? Authorizing Provider  albuterol (VENTOLIN HFA) 108 (90 Base) MCG/ACT inhaler INHALE 2 PUFFS INTO THE LUNGS EVERY 6 HOURS AS NEEDED 05/15/19  Yes [name], MD  levothyroxine (SYNTHROID) 112 MCG tablet TAKE 1 TABLET(112 MCG) BY MOUTH DAILY 03/20/19  Yes [name], MD  sertraline  (ZOLOFT) 50 MG tablet Take 1 tablet (50 mg total) by mouth daily. 06/06/19  Yes [name], MD  acetaminophen (TYLENOL) 500 MG tablet Take 1 tablet (500 mg total) by mouth every 6 (six) hours as needed. 06/06/19   Chase Picket, MD  fluticasone (FLONASE) 50 MCG/ACT nasal spray Place 2 sprays into both nostrils daily. 05/11/19   [name], MD    Family History Family History  Problem Relation Age of Onset  . Hypertension Mother   . Cancer Mother 39       lung CA  . Hypertension Father     Social History Social History   Tobacco Use  . Smoking status: Never Smoker  . Smokeless tobacco: Never Used  Substance Use Topics  . Alcohol use: Yes    Comment: occasionally  . Drug use: No     Allergies   Lidocaine   Review of Systems Review of Systems  Constitutional: Negative.   HENT: Positive for mouth sores and sore throat. Negative for congestion, postnasal drip, rhinorrhea, sinus pressure, sinus pain and voice change.   Eyes: Negative for photophobia, pain, discharge, redness and visual disturbance.  Respiratory: Negative for cough, choking and shortness of breath.   Gastrointestinal: Negative.  Negative for diarrhea,  nausea and vomiting.  Genitourinary: Negative.   Musculoskeletal: Negative.   Skin: Positive for rash.  Neurological: Negative for dizziness, light-headedness, numbness and headaches.     Physical Exam Triage Vital Signs ED Triage Vitals  Enc Vitals Group     BP 06/06/19 1846 129/65     Pulse Rate 06/06/19 1846 63     Resp 06/06/19 1846 18     Temp 06/06/19 1846 98.2 F (36.8 C)     Temp Source 06/06/19 1846 Oral     SpO2 06/06/19 1846 100 %     Weight 06/06/19 1842 239 lb (108.4 kg)     Height 06/06/19 1842 5' 8.5" (1.74 m)     Head Circumference --      Peak Flow --      Pain Score 06/06/19 1841 8     Pain Loc --      Pain Edu? --      Excl. in [place]? --    No data found.  Updated Vital Signs BP 129/65 (BP Location: Left Arm)   Pulse  63   Temp 98.2 F (36.8 C) (Oral)   Resp 18   Ht 5' 8.5" (1.74 m)   Wt 108.4 kg   LMP 06/02/2019 (Approximate)   SpO2 100%   Breastfeeding No   BMI 35.81 kg/m   Visual Acuity Right Eye Distance:   Left Eye Distance:   Bilateral Distance:    Right Eye Near:   Left Eye Near:    Bilateral Near:     Physical Exam Vitals signs and nursing note reviewed.  Constitutional:      General: She is not in acute distress.    Appearance: She is not ill-appearing.  HENT:     Right Ear: Tympanic membrane normal.     Left Ear: Tympanic membrane normal.     Nose: No congestion or rhinorrhea.     Mouth/Throat:     Mouth: Mucous membranes are moist. Mucous membranes are pale. Oral lesions present.     Tonsils: No tonsillar exudate. 0 on the right. 0 on the left.  Eyes:     Conjunctiva/sclera: Conjunctivae normal.     Pupils: Pupils are equal, round, and reactive to light.  Neck:     Musculoskeletal: Normal range of motion and neck supple.  Cardiovascular:     Rate and Rhythm: Normal rate and regular rhythm.     Heart sounds: Normal heart sounds. No murmur. No friction rub.  Pulmonary:     Effort: Pulmonary effort is normal.     Breath sounds: Normal breath sounds.  Abdominal:     General: There is no distension.     Palpations: Abdomen is soft.     Tenderness: There is no abdominal tenderness.  Lymphadenopathy:     Cervical: No cervical adenopathy.  Skin:    Capillary Refill: Capillary refill takes less than 2 seconds.     Findings: Rash present.     Comments: Papular rash on the hands bilaterally.  No desquamation.  Neurological:     Mental Status: She is alert.      UC Treatments / Results  Labs (all labs ordered are listed, but only abnormal results are displayed) Labs Reviewed  RAPID STREP SCREEN ([facility] ONLY)  CULTURE, GROUP A STREP [facility])    EKG   Radiology No results found.  Procedures Procedures (including critical care time)  Medications  Ordered in UC Medications - No data to display  Initial Impression /  Assessment and Plan / UC Course  I have reviewed the triage vital signs and the nursing notes.  Pertinent labs & imaging results that were available during my care of the patient were reviewed by me and considered in my medical decision making (see chart for details).     1.  Hand-foot-and-mouth syndrome: Symptomatic treatment with salt water gargle Tylenol as needed for fever/body aches Symptoms usually self-limiting If patient starts developing high fever, nausea vomiting which is persistent she needs to return to urgent care to be reevaluated. Final Clinical Impressions(s) / UC Diagnoses   Final diagnoses:  Sore throat   Discharge Instructions   None    ED Prescriptions    Medication Sig Dispense Auth. Provider   acetaminophen (TYLENOL) 500 MG tablet Take 1 tablet (500 mg total) by mouth every 6 (six) hours as needed. 30 tablet Angelyne Terwilliger, Myrene Galas, MD     PDMP not reviewed this encounter.   Chase Picket, MD 06/06/19 2006

## 2019-06-07 ENCOUNTER — Telehealth: Payer: Self-pay | Admitting: Family Medicine

## 2019-06-07 ENCOUNTER — Telehealth: Payer: Self-pay

## 2019-06-07 MED ORDER — MAGIC MOUTHWASH W/LIDOCAINE: 5.0000 mL | Freq: Four times a day (QID) | ORAL | 0 refills | Status: DC | PRN

## 2019-06-07 NOTE — Telephone Encounter (Signed)
1 part viscous lidocaine: 1 part maalox:  1 part benadryl. Thank you.

## 2019-06-07 NOTE — Telephone Encounter (Signed)
Please advise on ratios for the pharmacy

## 2019-06-07 NOTE — Telephone Encounter (Signed)
Per TA ok to send in magic mouthwash w/lidocaine/thx dmf

## 2019-06-07 NOTE — Telephone Encounter (Signed)
Pharmacy is aware

## 2019-06-07 NOTE — Telephone Encounter (Signed)
Morrine with [facility] and states that they received a prescription for magic mouthwash w/lidocaine [facility] States that they are needing to know the ratios for this prescription. Please advise.  CB#: [phone]

## 2019-06-09 LAB — CULTURE, GROUP A STREP (THRC)

## 2019-06-20 ENCOUNTER — Ambulatory Visit (INDEPENDENT_AMBULATORY_CARE_PROVIDER_SITE_OTHER): Payer: 59 | Admitting: Psychology

## 2019-06-20 DIAGNOSIS — F411 Generalized anxiety disorder: Secondary | ICD-10-CM

## 2019-07-04 ENCOUNTER — Ambulatory Visit: Payer: 59 | Admitting: Psychology

## 2019-07-18 ENCOUNTER — Ambulatory Visit (INDEPENDENT_AMBULATORY_CARE_PROVIDER_SITE_OTHER): Payer: 59 | Admitting: Psychology

## 2019-07-18 DIAGNOSIS — F411 Generalized anxiety disorder: Secondary | ICD-10-CM

## 2019-08-01 ENCOUNTER — Ambulatory Visit: Payer: 59 | Admitting: Psychology

## 2019-08-15 ENCOUNTER — Ambulatory Visit (INDEPENDENT_AMBULATORY_CARE_PROVIDER_SITE_OTHER): Payer: 59 | Admitting: Psychology

## 2019-08-15 DIAGNOSIS — F411 Generalized anxiety disorder: Secondary | ICD-10-CM | POA: Diagnosis not present

## 2019-08-29 ENCOUNTER — Ambulatory Visit: Payer: 59 | Admitting: Psychology

## 2019-09-12 ENCOUNTER — Ambulatory Visit (INDEPENDENT_AMBULATORY_CARE_PROVIDER_SITE_OTHER): Payer: 59 | Admitting: Psychology

## 2019-09-12 DIAGNOSIS — F411 Generalized anxiety disorder: Secondary | ICD-10-CM | POA: Diagnosis not present

## 2019-09-15 ENCOUNTER — Ambulatory Visit: Payer: 59 | Admitting: Psychology

## 2019-09-20 ENCOUNTER — Encounter: Payer: Self-pay | Admitting: Family Medicine

## 2019-10-10 ENCOUNTER — Ambulatory Visit: Payer: 59 | Admitting: Psychology

## 2019-10-12 ENCOUNTER — Ambulatory Visit (INDEPENDENT_AMBULATORY_CARE_PROVIDER_SITE_OTHER): Payer: 59 | Admitting: Psychology

## 2019-10-12 DIAGNOSIS — F411 Generalized anxiety disorder: Secondary | ICD-10-CM | POA: Diagnosis not present

## 2019-11-07 ENCOUNTER — Ambulatory Visit (INDEPENDENT_AMBULATORY_CARE_PROVIDER_SITE_OTHER): Payer: 59 | Admitting: Psychology

## 2019-11-07 DIAGNOSIS — F411 Generalized anxiety disorder: Secondary | ICD-10-CM

## 2019-12-12 ENCOUNTER — Ambulatory Visit (INDEPENDENT_AMBULATORY_CARE_PROVIDER_SITE_OTHER): Payer: 59 | Admitting: Psychology

## 2019-12-12 DIAGNOSIS — F411 Generalized anxiety disorder: Secondary | ICD-10-CM | POA: Diagnosis not present

## 2019-12-18 ENCOUNTER — Encounter: Payer: Self-pay | Admitting: Primary Care

## 2019-12-20 ENCOUNTER — Other Ambulatory Visit: Payer: Self-pay

## 2019-12-20 ENCOUNTER — Ambulatory Visit: Payer: Managed Care, Other (non HMO) | Admitting: Primary Care

## 2019-12-20 ENCOUNTER — Encounter: Payer: Self-pay | Admitting: Primary Care

## 2019-12-20 VITALS — BP 110/74 | HR 60 | Temp 96.9°F | Ht 68.5 in | Wt 236.0 lb

## 2019-12-20 DIAGNOSIS — E039 Hypothyroidism, unspecified: Secondary | ICD-10-CM | POA: Diagnosis not present

## 2019-12-20 DIAGNOSIS — F418 Other specified anxiety disorders: Secondary | ICD-10-CM | POA: Diagnosis not present

## 2019-12-20 MED ORDER — LEVOTHYROXINE SODIUM 112 MCG PO TABS: ORAL_TABLET | ORAL | 1 refills | Status: DC

## 2019-12-20 MED ORDER — SERTRALINE HCL 50 MG PO TABS: 50.0000 mg | ORAL_TABLET | Freq: Every day | ORAL | 3 refills | Status: DC

## 2019-12-20 NOTE — Assessment & Plan Note (Signed)
Taking levothyroxine correctly for the most part. Has been on 112 mcg for years, TSH is UTD and due again in July/August 2021.  Refills sent to pharmacy.

## 2019-12-20 NOTE — Progress Notes (Signed)
Subjective:    Patient ID: Holly Singleton, female    DOB: 1986-02-15, 34 y.o.   MRN: [id number]  HPI  This visit occurred during the SARS-CoV-2 public health emergency.  Safety protocols were in place, including screening questions prior to the visit, additional usage of staff PPE, and extensive cleaning of exam room while observing appropriate contact time as indicated for disinfecting solutions.   Holly Singleton is a 34 year old female who presents today to transfer care from Dr. [name].  1) Hypothyroidism: Diagnosed around 2005. Currently managed on levothyroxine 112 mcg. She is taking her levothyroxine every morning on an empty stomach with water only. She sometimes forgets to take her medication and will have it after breakfast. She takes her Zoloft in the afternoon. Her last TSH was 2.77 in July 2020.  2) Anxiety and Depression: Currently managed on Zoloft 50 mg for which she's taken for several years. Overall feels well managed on this regimen.   BP Readings from Last 3 Encounters:  12/20/19 110/74  06/06/19 129/65  03/20/19 114/68     Review of Systems  Respiratory: Negative for shortness of breath.   Cardiovascular: Negative for chest pain.  Psychiatric/Behavioral:       See HPI       Past Medical History:  Diagnosis Date  . Anxiety   . Depression   . Hypothyroidism   . Morbid obesity ([place])      Social History   Socioeconomic History  . Marital status: Married    Spouse name: Not on file  . Number of children: Not on file  . Years of education: Not on file  . Highest education level: Not on file  Occupational History  . 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 Smoking status: Never Smoker  . Smokeless tobacco: Never Used  Substance and Sexual Activity  . Alcohol use: Yes    Comment: occasionally  . Drug use: No  . Sexual activity: Yes  Other Topics Concern  . Not on file  Social History Narrative   Recently  divorced.   Social Determinants of Health   Financial Resource Strain:   . Difficulty of Paying Living Expenses:   Food Insecurity:   . Worried About Charity fundraiser in the Last Year:   . Arboriculturist in the Last Year:   Transportation Needs:   . Film/video editor (Medical):   [name] Lack of Transportation (Non-Medical):   Physical Activity:   . Days of Exercise per Week:   . Minutes of Exercise per Session:   Stress:   . Feeling of Stress :   Social Connections:   . Frequency of Communication with Friends and Family:   . Frequency of Social Gatherings with Friends and Family:   . Attends Religious Services:   . Active Member of Clubs or Organizations:   . Attends Archivist Meetings:   [name] Marital Status:   Intimate Partner Violence:   . Fear of Current or Ex-Partner:   . Emotionally Abused:   [name] Physically Abused:   . Sexually Abused:     Past Surgical History:  Procedure Laterality Date  . APPENDECTOMY      Family History  Problem Relation Age of Onset  . Hypertension Mother   . Lung cancer Mother 19  . Hypothyroidism Mother   . Hypertension Father   . Autism Brother   . Depression Brother  Allergies  Allergen Reactions  . Lidocaine Other (See Comments)    Severe burning    No current outpatient medications on file prior to visit.   No current facility-administered medications on file prior to visit.    BP 110/74   Pulse 60   Temp (!) 96.9 F (36.1 C) (Temporal)   Ht 5' 8.5" (1.74 m)   Wt 236 lb (107 kg)   LMP 11/19/2019   SpO2 98%   BMI 35.36 kg/m    Objective:   Physical Exam  Constitutional: She appears well-nourished.  Cardiovascular: Normal rate and regular rhythm.  Respiratory: Effort normal and breath sounds normal.  Musculoskeletal:     Cervical back: Neck supple.  Skin: Skin is warm and dry.  Psychiatric: She has a normal mood and affect.           Assessment & Plan:

## 2019-12-20 NOTE — Patient Instructions (Signed)
Be sure to take your levothyroxine (thyroid medication) every morning on an empty stomach with water only. No food or other medications for 30 minutes. No heartburn medication, iron pills, calcium, vitamin D, or magnesium pills within four hours of taking levothyroxine.   Please schedule a physical with me for late July or early August. You may also schedule a lab only appointment 3-4 days prior. We will discuss your lab results in detail during your physical.  It was a pleasure meeting you!

## 2019-12-20 NOTE — Assessment & Plan Note (Signed)
Overall doing well on sertraline 50 mg, continue same. Refills sent to pharmacy.

## 2020-01-02 ENCOUNTER — Ambulatory Visit: Payer: 59 | Admitting: Psychology

## 2020-01-05 ENCOUNTER — Ambulatory Visit (INDEPENDENT_AMBULATORY_CARE_PROVIDER_SITE_OTHER): Payer: 59 | Admitting: Psychology

## 2020-01-05 DIAGNOSIS — F411 Generalized anxiety disorder: Secondary | ICD-10-CM

## 2020-01-30 ENCOUNTER — Ambulatory Visit (INDEPENDENT_AMBULATORY_CARE_PROVIDER_SITE_OTHER): Payer: 59 | Admitting: Psychology

## 2020-01-30 DIAGNOSIS — F411 Generalized anxiety disorder: Secondary | ICD-10-CM

## 2020-02-28 ENCOUNTER — Other Ambulatory Visit: Payer: Self-pay | Admitting: Primary Care

## 2020-02-28 DIAGNOSIS — E039 Hypothyroidism, unspecified: Secondary | ICD-10-CM

## 2020-02-28 DIAGNOSIS — Z0000 Encounter for general adult medical examination without abnormal findings: Secondary | ICD-10-CM

## 2020-02-28 DIAGNOSIS — Z1159 Encounter for screening for other viral diseases: Secondary | ICD-10-CM

## 2020-03-12 ENCOUNTER — Ambulatory Visit (INDEPENDENT_AMBULATORY_CARE_PROVIDER_SITE_OTHER): Payer: 59 | Admitting: Psychology

## 2020-03-12 DIAGNOSIS — F411 Generalized anxiety disorder: Secondary | ICD-10-CM

## 2020-03-14 ENCOUNTER — Other Ambulatory Visit: Payer: Self-pay

## 2020-03-14 ENCOUNTER — Other Ambulatory Visit (INDEPENDENT_AMBULATORY_CARE_PROVIDER_SITE_OTHER): Payer: Managed Care, Other (non HMO)

## 2020-03-14 DIAGNOSIS — Z0000 Encounter for general adult medical examination without abnormal findings: Secondary | ICD-10-CM | POA: Diagnosis not present

## 2020-03-14 DIAGNOSIS — E039 Hypothyroidism, unspecified: Secondary | ICD-10-CM

## 2020-03-14 DIAGNOSIS — Z1159 Encounter for screening for other viral diseases: Secondary | ICD-10-CM

## 2020-03-15 ENCOUNTER — Other Ambulatory Visit: Payer: Managed Care, Other (non HMO)

## 2020-03-15 LAB — COMPREHENSIVE METABOLIC PANEL
ALT: 20 IU/L (ref 0–32)
AST: 22 IU/L (ref 0–40)
Albumin/Globulin Ratio: 1.6 (ref 1.2–2.2)
Albumin: 4.1 g/dL (ref 3.8–4.8)
Alkaline Phosphatase: 74 IU/L (ref 48–121)
BUN/Creatinine Ratio: 17 (ref 9–23)
BUN: 14 mg/dL (ref 6–20)
Bilirubin Total: 0.4 mg/dL (ref 0.0–1.2)
CO2: 23 mmol/L (ref 20–29)
Calcium: 8.9 mg/dL (ref 8.7–10.2)
Chloride: 104 mmol/L (ref 96–106)
Creatinine, Ser: 0.81 mg/dL (ref 0.57–1.00)
GFR calc Af Amer: 109 mL/min/{1.73_m2} (ref >59)
GFR calc non Af Amer: 94 mL/min/{1.73_m2} (ref >59)
Globulin, Total: 2.5 g/dL (ref 1.5–4.5)
Glucose: 94 mg/dL (ref 65–99)
Potassium: 4.3 mmol/L (ref 3.5–5.2)
Sodium: 140 mmol/L (ref 134–144)
Total Protein: 6.6 g/dL (ref 6.0–8.5)

## 2020-03-15 LAB — LIPID PANEL
Chol/HDL Ratio: 5.3 ratio — ABNORMAL HIGH (ref 0.0–4.4)
Cholesterol, Total: 226 mg/dL — ABNORMAL HIGH (ref 100–199)
HDL: 43 mg/dL (ref >39)
LDL Chol Calc (NIH): 150 mg/dL — ABNORMAL HIGH (ref 0–99)
Triglycerides: 184 mg/dL — ABNORMAL HIGH (ref 0–149)
VLDL Cholesterol Cal: 33 mg/dL (ref 5–40)

## 2020-03-15 LAB — TSH: TSH: 3.13 u[IU]/mL (ref 0.450–4.500)

## 2020-03-15 LAB — HEPATITIS C ANTIBODY: Hep C Virus Ab: <0.1 s/co ratio (ref 0.0–0.9)

## 2020-03-22 ENCOUNTER — Other Ambulatory Visit: Payer: Self-pay

## 2020-03-22 ENCOUNTER — Ambulatory Visit (INDEPENDENT_AMBULATORY_CARE_PROVIDER_SITE_OTHER): Payer: Managed Care, Other (non HMO) | Admitting: Primary Care

## 2020-03-22 ENCOUNTER — Encounter: Payer: Self-pay | Admitting: Primary Care

## 2020-03-22 VITALS — BP 118/82 | HR 88 | Temp 96.3°F | Ht 68.5 in | Wt 241.5 lb

## 2020-03-22 DIAGNOSIS — E039 Hypothyroidism, unspecified: Secondary | ICD-10-CM

## 2020-03-22 DIAGNOSIS — E785 Hyperlipidemia, unspecified: Secondary | ICD-10-CM

## 2020-03-22 DIAGNOSIS — Z0000 Encounter for general adult medical examination without abnormal findings: Secondary | ICD-10-CM

## 2020-03-22 DIAGNOSIS — K219 Gastro-esophageal reflux disease without esophagitis: Secondary | ICD-10-CM | POA: Diagnosis not present

## 2020-03-22 DIAGNOSIS — F418 Other specified anxiety disorders: Secondary | ICD-10-CM

## 2020-03-22 MED ORDER — LEVOTHYROXINE SODIUM 112 MCG PO TABS: ORAL_TABLET | ORAL | 3 refills | Status: DC

## 2020-03-22 NOTE — Patient Instructions (Addendum)
Continue exercising. You should be getting 150 minutes of moderate intensity exercise weekly.  It's important to improve your diet by reducing consumption of fast food, fried food, processed snack foods, sugary drinks. Increase consumption of fresh vegetables and fruits, whole grains, water.  Ensure you are drinking 64 ounces of water daily.  Set up a lab appointment to return in 6 months for cholesterol check.  It was a pleasure to see you today!   Preventive Care 1-88 Years Old, Female Preventive care refers to visits with your health care provider and lifestyle choices that can promote health and wellness. This includes:  A yearly physical exam. This may also be called an annual well check.  Regular dental visits and eye exams.  Immunizations.  Screening for certain conditions.  Healthy lifestyle choices, such as eating a healthy diet, getting regular exercise, not using drugs or products that contain nicotine and tobacco, and limiting alcohol use. What can I expect for my preventive care visit? Physical exam Your health care provider will check your:  Height and weight. This may be used to calculate body mass index (BMI), which tells if you are at a healthy weight.  Heart rate and blood pressure.  Skin for abnormal spots. Counseling Your health care provider may ask you questions about your:  Alcohol, tobacco, and drug use.  Emotional well-being.  Home and relationship well-being.  Sexual activity.  Eating habits.  Work and work Statistician.  Method of birth control.  Menstrual cycle.  Pregnancy history. What immunizations do I need?  Influenza (flu) vaccine  This is recommended every year. Tetanus, diphtheria, and pertussis (Tdap) vaccine  You may need a Td booster every 10 years. Varicella (chickenpox) vaccine  You may need this if you have not been vaccinated. Human papillomavirus (HPV) vaccine  If recommended by your health care provider, you  may need three doses over 6 months. Measles, mumps, and rubella (MMR) vaccine  You may need at least one dose of MMR. You may also need a second dose. Meningococcal conjugate (MenACWY) vaccine  One dose is recommended if you are age 27-21 years and a first-year college student living in a residence hall, or if you have one of several medical conditions. You may also need additional booster doses. Pneumococcal conjugate (PCV13) vaccine  You may need this if you have certain conditions and were not previously vaccinated. Pneumococcal polysaccharide (PPSV23) vaccine  You may need one or two doses if you smoke cigarettes or if you have certain conditions. Hepatitis A vaccine  You may need this if you have certain conditions or if you travel or work in places where you may be exposed to hepatitis A. Hepatitis B vaccine  You may need this if you have certain conditions or if you travel or work in places where you may be exposed to hepatitis B. Haemophilus influenzae type b (Hib) vaccine  You may need this if you have certain conditions. You may receive vaccines as individual doses or as more than one vaccine together in one shot (combination vaccines). Talk with your health care provider about the risks and benefits of combination vaccines. What tests do I need?  Blood tests  Lipid and cholesterol levels. These may be checked every 5 years starting at age 2.  Hepatitis C test.  Hepatitis B test. Screening  Diabetes screening. This is done by checking your blood sugar (glucose) after you have not eaten for a while (fasting).  Sexually transmitted disease (STD) testing.  BRCA-related cancer  screening. This may be done if you have a family history of breast, ovarian, tubal, or peritoneal cancers.  Pelvic exam and Pap test. This may be done every 3 years starting at age 36. Starting at age 107, this may be done every 5 years if you have a Pap test in combination with an HPV test. Talk  with your health care provider about your test results, treatment options, and if necessary, the need for more tests. Follow these instructions at home: Eating and drinking   Eat a diet that includes fresh fruits and vegetables, whole grains, lean protein, and low-fat dairy.  Take vitamin and mineral supplements as recommended by your health care provider.  Do not drink alcohol if: ? Your health care provider tells you not to drink. ? You are pregnant, may be pregnant, or are planning to become pregnant.  If you drink alcohol: ? Limit how much you have to 0-1 drink a day. ? Be aware of how much alcohol is in your drink. In the [place] one drink equals one 12 oz bottle of beer (355 mL), one 5 oz glass of wine (148 mL), or one 1 oz glass of hard liquor (44 mL). Lifestyle  Take daily care of your teeth and gums.  Stay active. Exercise for at least 30 minutes on 5 or more days each week.  Do not use any products that contain nicotine or tobacco, such as cigarettes, e-cigarettes, and chewing tobacco. If you need help quitting, ask your health care provider.  If you are sexually active, practice safe sex. Use a condom or other form of birth control (contraception) in order to prevent pregnancy and STIs (sexually transmitted infections). If you plan to become pregnant, see your health care provider for a preconception visit. What's next?  Visit your health care provider once a year for a well check visit.  Ask your health care provider how often you should have your eyes and teeth checked.  Stay up to date on all vaccines. This information is not intended to replace advice given to you by your health care provider. Make sure you discuss any questions you have with your health care provider. Document Revised: 04/28/2018 Document Reviewed: 04/28/2018 Elsevier Patient Education  2020 [employer].

## 2020-03-22 NOTE — Assessment & Plan Note (Addendum)
Once previously on medication treatment, lost weight and numbers improved. LDL of 150. Strong FH of hyperlipidemia in both parents.  She admits to an unhealthy diet recently. Would like to work on diet and exercise.   Continue to monitor. Repeat in 6 months.

## 2020-03-22 NOTE — Progress Notes (Signed)
Subjective:    Patient ID: Holly Singleton, female    DOB: 1985/10/30, 34 y.o.   MRN: [id number]  HPI  This visit occurred during the SARS-CoV-2 public health emergency.  Safety protocols were in place, including screening questions prior to the visit, additional usage of staff PPE, and extensive cleaning of exam room while observing appropriate contact time as indicated for disinfecting solutions.   Holly Singleton is a 34 year old female who presents today for complete physical.  Immunizations: -Tetanus: Completed in 2014 -Influenza: Due this season  -Covid-19: Completed series   Diet: She endorses a poor diet recently, is working on changing Exercise: She is running and doing pilates.   Eye exam:  No recent exam Dental exam: Completes semi-annually   Pap Smear: Completed in 2020  BP Readings from Last 3 Encounters:  03/22/20 118/82  12/20/19 110/74  06/06/19 129/65     Review of Systems  Constitutional: Negative for unexpected weight change.  HENT: Negative for rhinorrhea.   Respiratory: Negative for cough and shortness of breath.   Cardiovascular: Negative for chest pain.  Gastrointestinal: Negative for constipation and diarrhea.  Genitourinary: Negative for difficulty urinating and menstrual problem.  Musculoskeletal: Negative for arthralgias and myalgias.  Skin: Negative for rash.  Allergic/Immunologic: Negative for environmental allergies.  Neurological: Negative for dizziness, numbness and headaches.  Psychiatric/Behavioral: The patient is not nervous/anxious.        Past Medical History:  Diagnosis Date  . Anxiety   . Depression   . Gestational HTN 10/07/2017  . Hypothyroidism   . Morbid obesity ([place])   . Preeclampsia, third trimester 10/07/2017     Social History   Socioeconomic History  . Marital status: Married    Spouse name: Not on file  . Number of children: Not on file  . Years of education: Not on file  . Highest education level: Not on file   Occupational History  . 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 Smoking status: Never Smoker  . Smokeless tobacco: Never Used  Vaping Use  . Vaping Use: Never used  Substance and Sexual Activity  . Alcohol use: Yes    Comment: occasionally  . Drug use: No  . Sexual activity: Yes  Other Topics Concern  . Not on file  Social History Narrative   Recently divorced.   Social Determinants of Health   Financial Resource Strain:   . Difficulty of Paying Living Expenses:   Food Insecurity:   . Worried About Charity fundraiser in the Last Year:   . Arboriculturist in the Last Year:   Transportation Needs:   . Film/video editor (Medical):   [name] Lack of Transportation (Non-Medical):   Physical Activity:   . Days of Exercise per Week:   . Minutes of Exercise per Session:   Stress:   . Feeling of Stress :   Social Connections:   . Frequency of Communication with Friends and Family:   . Frequency of Social Gatherings with Friends and Family:   . Attends Religious Services:   . Active Member of Clubs or Organizations:   . Attends Archivist Meetings:   [name] Marital Status:   Intimate Partner Violence:   . Fear of Current or Ex-Partner:   . Emotionally Abused:   [name] Physically Abused:   . Sexually Abused:     Past Surgical History:  Procedure Laterality Date  .  APPENDECTOMY      Family History  Problem Relation Age of Onset  . Hypertension Mother   . Lung cancer Mother 34  . Hypothyroidism Mother   . Hypertension Father   . Autism Brother   . Depression Brother     Allergies  Allergen Reactions  . Lidocaine Other (See Comments)    Severe burning    Current Outpatient Medications on File Prior to Visit  Medication Sig Dispense Refill  . sertraline (ZOLOFT) 50 MG tablet Take 1 tablet (50 mg total) by mouth daily. For anxiety and depression. 90 tablet 3   No current facility-administered medications on file prior  to visit.    BP 118/82   Pulse 88   Temp (!) 96.3 F (35.7 C) (Temporal)   Ht 5' 8.5" (1.74 m)   Wt (!) 241 lb 8 oz (109.5 kg)   LMP 02/26/2020   SpO2 98%   BMI 36.19 kg/m    Objective:   Physical Exam HENT:     Right Ear: Tympanic membrane and ear canal normal.     Left Ear: Tympanic membrane and ear canal normal.  Eyes:     Pupils: Pupils are equal, round, and reactive to light.  Cardiovascular:     Rate and Rhythm: Normal rate and regular rhythm.  Pulmonary:     Effort: Pulmonary effort is normal.     Breath sounds: Normal breath sounds.  Abdominal:     General: Bowel sounds are normal.     Palpations: Abdomen is soft.     Tenderness: There is no abdominal tenderness.  Musculoskeletal:        General: Normal range of motion.     Cervical back: Neck supple.  Skin:    General: Skin is warm and dry.  Neurological:     Mental Status: She is alert and oriented to person, place, and time.     Cranial Nerves: No cranial nerve deficit.     Deep Tendon Reflexes:     Reflex Scores:      Patellar reflexes are 2+ on the right side and 2+ on the left side. Psychiatric:        Mood and Affect: Mood normal.            Assessment & Plan:

## 2020-03-22 NOTE — Assessment & Plan Note (Signed)
She is taking levothyroxine correctly for the most part except sometimes with coffee.  Recent TSH stable.  Continue levothyroxine 112 mcg.

## 2020-03-22 NOTE — Assessment & Plan Note (Signed)
Occurring 1-2 times weekly on average, taking Pepcid with relief. Continue to monitor.

## 2020-03-22 NOTE — Assessment & Plan Note (Signed)
Tetanus UTD. Pap smear UTD per patient.  Discussed the importance of a healthy diet and regular exercise in order for weight loss, and to reduce the risk of any potential medical problems.  Exam today unremarkable.  Labs reviewed.

## 2020-03-22 NOTE — Assessment & Plan Note (Signed)
Doing well on Zoloft 50 mg, continue same.

## 2020-04-09 ENCOUNTER — Ambulatory Visit (INDEPENDENT_AMBULATORY_CARE_PROVIDER_SITE_OTHER): Payer: 59 | Admitting: Psychology

## 2020-04-09 DIAGNOSIS — F411 Generalized anxiety disorder: Secondary | ICD-10-CM

## 2020-05-07 ENCOUNTER — Ambulatory Visit (INDEPENDENT_AMBULATORY_CARE_PROVIDER_SITE_OTHER): Payer: 59 | Admitting: Psychology

## 2020-05-07 DIAGNOSIS — F411 Generalized anxiety disorder: Secondary | ICD-10-CM

## 2020-05-21 ENCOUNTER — Ambulatory Visit (INDEPENDENT_AMBULATORY_CARE_PROVIDER_SITE_OTHER): Payer: 59 | Admitting: Psychology

## 2020-05-21 DIAGNOSIS — F411 Generalized anxiety disorder: Secondary | ICD-10-CM

## 2020-06-04 ENCOUNTER — Ambulatory Visit (INDEPENDENT_AMBULATORY_CARE_PROVIDER_SITE_OTHER): Payer: 59 | Admitting: Psychology

## 2020-06-04 DIAGNOSIS — F411 Generalized anxiety disorder: Secondary | ICD-10-CM

## 2020-06-18 ENCOUNTER — Ambulatory Visit (INDEPENDENT_AMBULATORY_CARE_PROVIDER_SITE_OTHER): Payer: 59 | Admitting: Psychology

## 2020-06-18 DIAGNOSIS — F411 Generalized anxiety disorder: Secondary | ICD-10-CM | POA: Diagnosis not present

## 2020-07-02 ENCOUNTER — Ambulatory Visit (INDEPENDENT_AMBULATORY_CARE_PROVIDER_SITE_OTHER): Payer: 59 | Admitting: Psychology

## 2020-07-02 DIAGNOSIS — F411 Generalized anxiety disorder: Secondary | ICD-10-CM

## 2020-07-16 ENCOUNTER — Ambulatory Visit (INDEPENDENT_AMBULATORY_CARE_PROVIDER_SITE_OTHER): Payer: 59 | Admitting: Psychology

## 2020-07-16 DIAGNOSIS — F411 Generalized anxiety disorder: Secondary | ICD-10-CM

## 2020-07-30 ENCOUNTER — Ambulatory Visit: Payer: 59 | Admitting: Psychology

## 2020-08-01 ENCOUNTER — Ambulatory Visit (INDEPENDENT_AMBULATORY_CARE_PROVIDER_SITE_OTHER): Payer: 59 | Admitting: Psychology

## 2020-08-01 DIAGNOSIS — F411 Generalized anxiety disorder: Secondary | ICD-10-CM | POA: Diagnosis not present

## 2020-08-13 ENCOUNTER — Ambulatory Visit (INDEPENDENT_AMBULATORY_CARE_PROVIDER_SITE_OTHER): Payer: 59 | Admitting: Psychology

## 2020-08-13 DIAGNOSIS — F411 Generalized anxiety disorder: Secondary | ICD-10-CM | POA: Diagnosis not present

## 2020-08-27 ENCOUNTER — Ambulatory Visit: Payer: 59 | Admitting: Psychology

## 2020-09-03 ENCOUNTER — Ambulatory Visit: Payer: 59 | Admitting: Psychology

## 2020-09-04 ENCOUNTER — Ambulatory Visit (INDEPENDENT_AMBULATORY_CARE_PROVIDER_SITE_OTHER): Payer: 59 | Admitting: Psychology

## 2020-09-04 DIAGNOSIS — F411 Generalized anxiety disorder: Secondary | ICD-10-CM

## 2020-09-10 ENCOUNTER — Ambulatory Visit (INDEPENDENT_AMBULATORY_CARE_PROVIDER_SITE_OTHER): Payer: 59 | Admitting: Psychology

## 2020-09-10 DIAGNOSIS — F411 Generalized anxiety disorder: Secondary | ICD-10-CM | POA: Diagnosis not present

## 2020-09-13 ENCOUNTER — Other Ambulatory Visit: Payer: Self-pay | Admitting: Primary Care

## 2020-09-13 DIAGNOSIS — E039 Hypothyroidism, unspecified: Secondary | ICD-10-CM

## 2020-09-13 DIAGNOSIS — E785 Hyperlipidemia, unspecified: Secondary | ICD-10-CM

## 2020-09-23 ENCOUNTER — Other Ambulatory Visit: Payer: Managed Care, Other (non HMO)

## 2020-09-24 ENCOUNTER — Ambulatory Visit (INDEPENDENT_AMBULATORY_CARE_PROVIDER_SITE_OTHER): Payer: 59 | Admitting: Psychology

## 2020-09-24 DIAGNOSIS — F411 Generalized anxiety disorder: Secondary | ICD-10-CM

## 2020-09-30 ENCOUNTER — Other Ambulatory Visit (INDEPENDENT_AMBULATORY_CARE_PROVIDER_SITE_OTHER): Payer: Managed Care, Other (non HMO)

## 2020-09-30 ENCOUNTER — Other Ambulatory Visit: Payer: Self-pay

## 2020-09-30 DIAGNOSIS — E785 Hyperlipidemia, unspecified: Secondary | ICD-10-CM

## 2020-09-30 DIAGNOSIS — E039 Hypothyroidism, unspecified: Secondary | ICD-10-CM | POA: Diagnosis not present

## 2020-10-01 DIAGNOSIS — E039 Hypothyroidism, unspecified: Secondary | ICD-10-CM

## 2020-10-01 LAB — LIPID PANEL
Chol/HDL Ratio: 4.5 ratio — ABNORMAL HIGH (ref 0.0–4.4)
Cholesterol, Total: 190 mg/dL (ref 100–199)
HDL: 42 mg/dL (ref >39)
LDL Chol Calc (NIH): 130 mg/dL — ABNORMAL HIGH (ref 0–99)
Triglycerides: 99 mg/dL (ref 0–149)
VLDL Cholesterol Cal: 18 mg/dL (ref 5–40)

## 2020-10-01 LAB — TSH: TSH: 5.43 u[IU]/mL — ABNORMAL HIGH (ref 0.450–4.500)

## 2020-10-08 ENCOUNTER — Ambulatory Visit (INDEPENDENT_AMBULATORY_CARE_PROVIDER_SITE_OTHER): Payer: 59 | Admitting: Psychology

## 2020-10-08 DIAGNOSIS — F411 Generalized anxiety disorder: Secondary | ICD-10-CM | POA: Diagnosis not present

## 2020-10-22 ENCOUNTER — Ambulatory Visit (INDEPENDENT_AMBULATORY_CARE_PROVIDER_SITE_OTHER): Payer: 59 | Admitting: Psychology

## 2020-10-22 DIAGNOSIS — F411 Generalized anxiety disorder: Secondary | ICD-10-CM

## 2020-10-30 ENCOUNTER — Other Ambulatory Visit (INDEPENDENT_AMBULATORY_CARE_PROVIDER_SITE_OTHER): Payer: Managed Care, Other (non HMO)

## 2020-10-30 ENCOUNTER — Other Ambulatory Visit: Payer: Self-pay

## 2020-10-30 DIAGNOSIS — E039 Hypothyroidism, unspecified: Secondary | ICD-10-CM | POA: Diagnosis not present

## 2020-10-31 LAB — TSH: TSH: 4.97 u[IU]/mL — ABNORMAL HIGH (ref 0.450–4.500)

## 2020-11-01 DIAGNOSIS — E039 Hypothyroidism, unspecified: Secondary | ICD-10-CM

## 2020-11-01 MED ORDER — LEVOTHYROXINE SODIUM 125 MCG PO TABS: ORAL_TABLET | ORAL | 0 refills | Status: DC

## 2020-11-05 ENCOUNTER — Ambulatory Visit: Payer: 59 | Admitting: Psychology

## 2020-11-19 ENCOUNTER — Ambulatory Visit (INDEPENDENT_AMBULATORY_CARE_PROVIDER_SITE_OTHER): Payer: 59 | Admitting: Psychology

## 2020-11-19 DIAGNOSIS — F411 Generalized anxiety disorder: Secondary | ICD-10-CM

## 2020-12-03 ENCOUNTER — Ambulatory Visit (INDEPENDENT_AMBULATORY_CARE_PROVIDER_SITE_OTHER): Payer: 59 | Admitting: Psychology

## 2020-12-03 DIAGNOSIS — F411 Generalized anxiety disorder: Secondary | ICD-10-CM

## 2020-12-17 ENCOUNTER — Ambulatory Visit (INDEPENDENT_AMBULATORY_CARE_PROVIDER_SITE_OTHER): Payer: 59 | Admitting: Psychology

## 2020-12-17 DIAGNOSIS — F411 Generalized anxiety disorder: Secondary | ICD-10-CM | POA: Diagnosis not present

## 2020-12-19 IMAGING — CT CT ABDOMEN AND PELVIS WITH CONTRAST
2 of 4 series · 15 of 46 positions shown, 17 images · IV contrast (APPLIED)
Comparison: Pelvic ultrasound 04/25/2013

CLINICAL DATA: Rectal bleeding. The patient reports dark red blood
mixed with stool. Upper abdominal burning.

EXAM:
CT ABDOMEN AND PELVIS WITH CONTRAST
TECHNIQUE: Multidetector CT imaging of the abdomen and pelvis was performed
using the standard protocol following bolus administration of
intravenous contrast.
CONTRAST:  100mL OMNIPAQUE IOHEXOL 300 MG/ML  SOLN

[Series 2: routine abd/pel with · axial · 0.87mm/px · z∈[-1192,-737]mm · 12 of 105 slices shown, 14 images]
[im 9/105  soft-tissue]
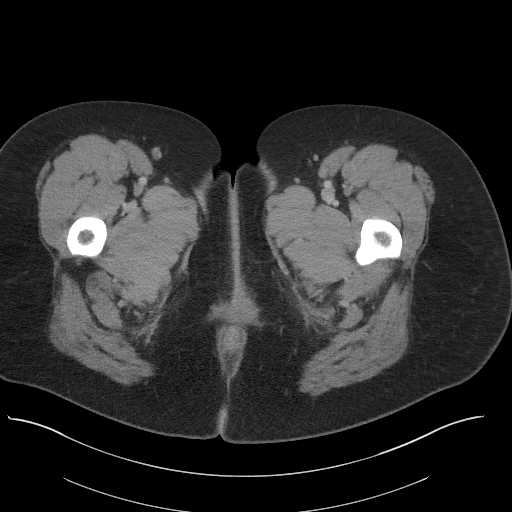
[im 9/105  bone]
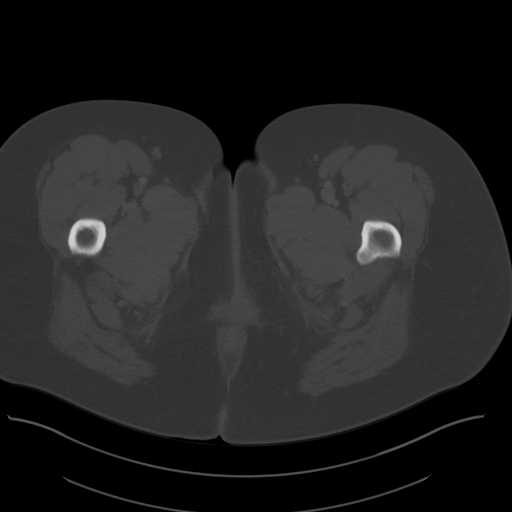
[im 17/105  soft-tissue]
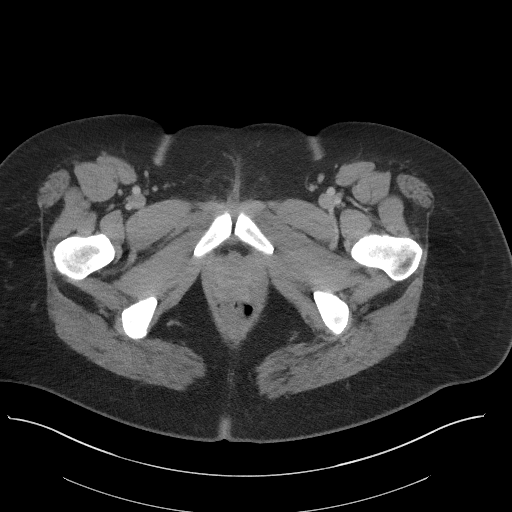
[im 25/105  soft-tissue]
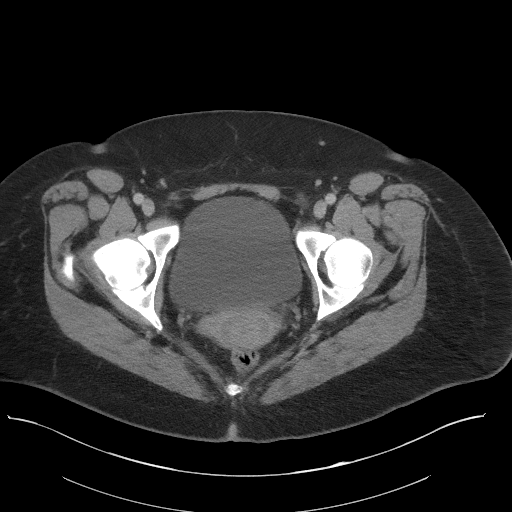
[im 34/105  soft-tissue]
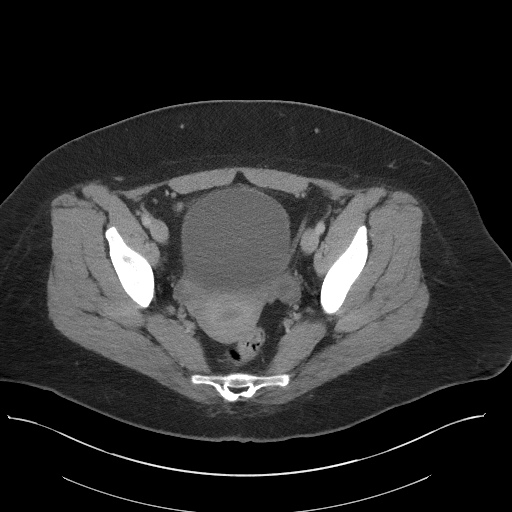
[im 42/105  soft-tissue]
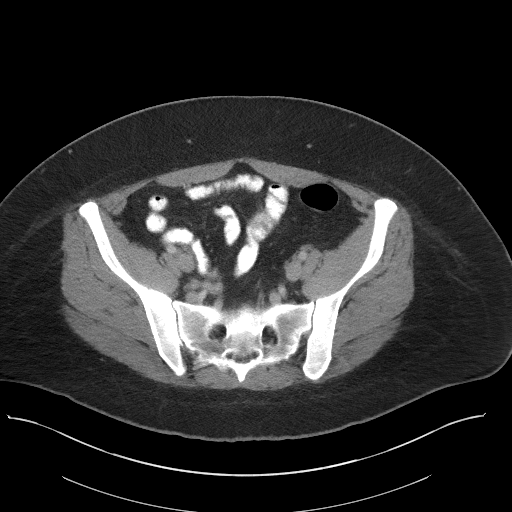
[im 50/105  soft-tissue]
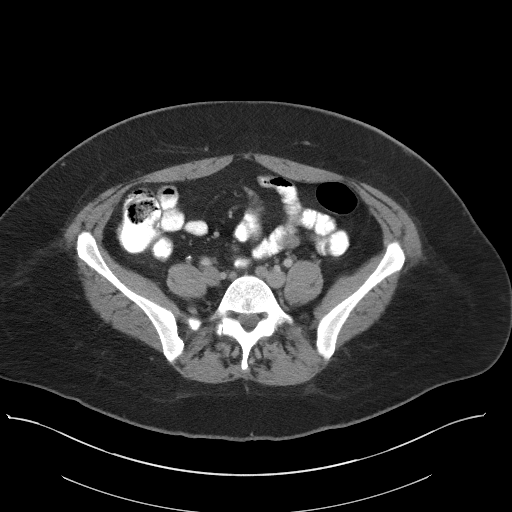
[im 59/105  soft-tissue]
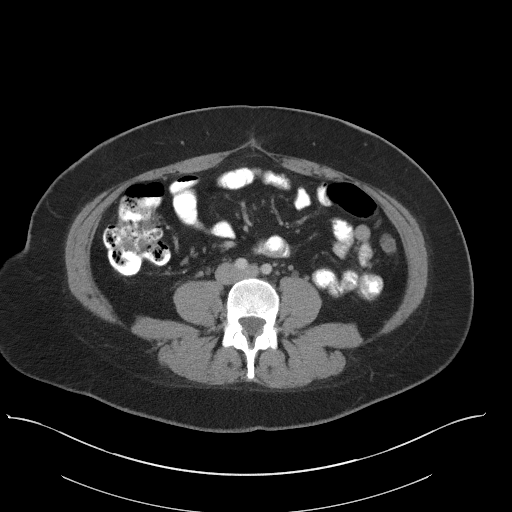
[im 67/105  soft-tissue]
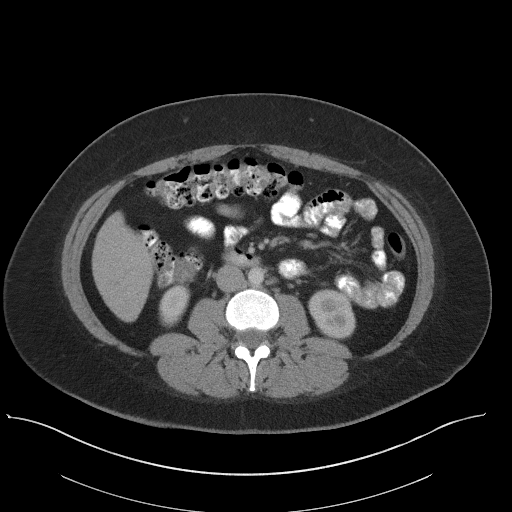
[im 75/105  soft-tissue]
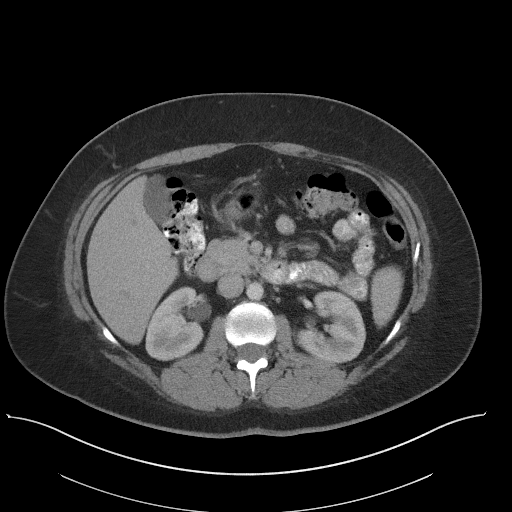
[im 75/105  bone]
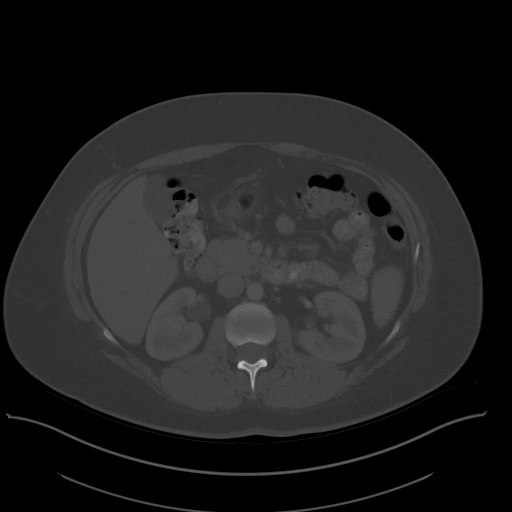
[im 84/105  soft-tissue]
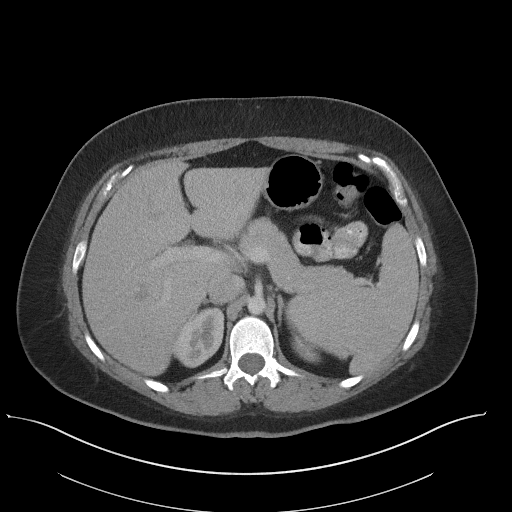
[im 92/105  soft-tissue]
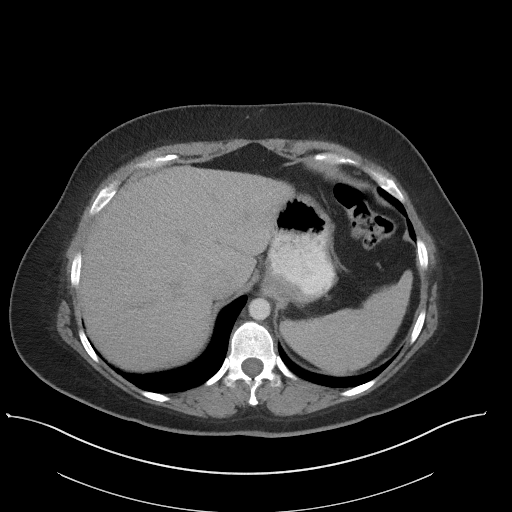
[im 100/105  soft-tissue]
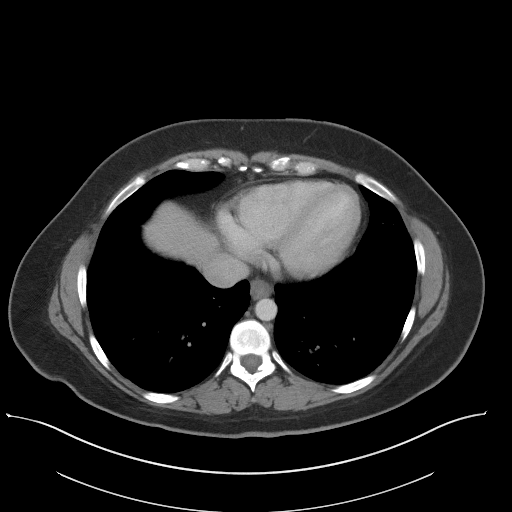

[Series 5: coronal st · coronal · 0.77mm/px · 3 of 90 slices shown]
[im 30/90  soft-tissue]
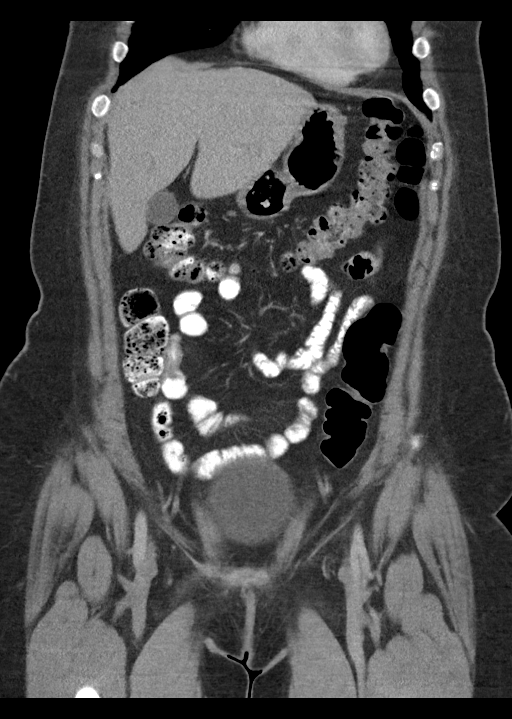
[im 40/90  soft-tissue]
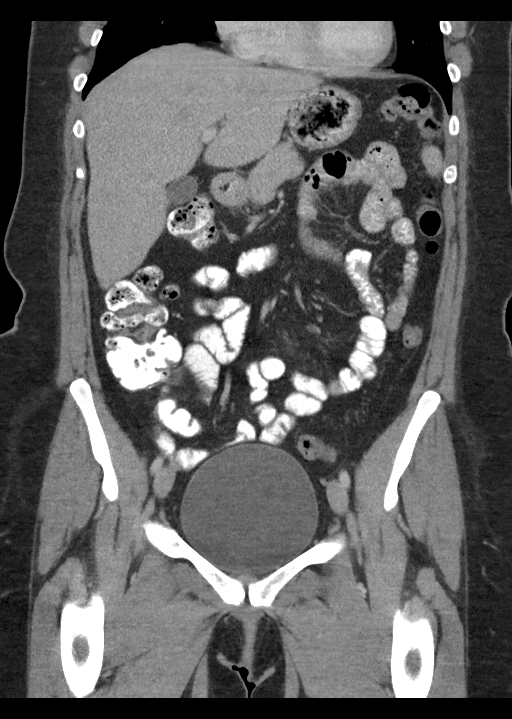
[im 50/90  soft-tissue]
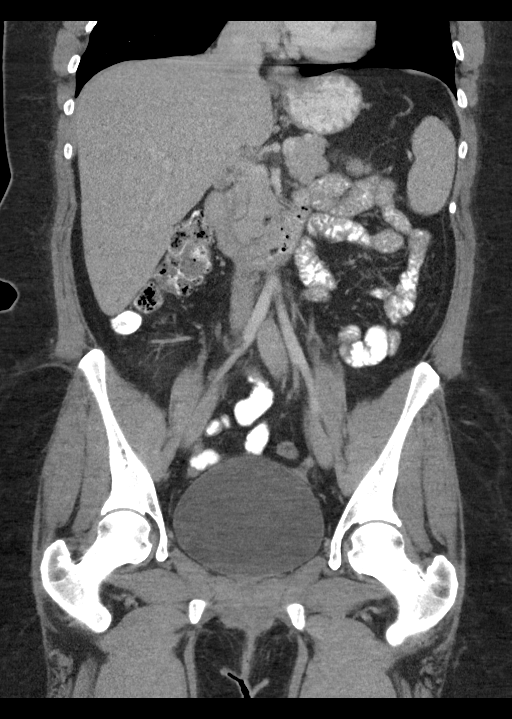

[15 of 46 positions shown; findings below may reference images not displayed]

FINDINGS: Lower chest: Minimal atelectasis is present. Lungs are otherwise
clear. Heart size is normal. No significant pleural or pericardial
effusion is present.

Hepatobiliary: No focal liver abnormality is seen. No gallstones,
gallbladder wall thickening, or biliary dilatation.

Pancreas: Unremarkable. No pancreatic ductal dilatation or
surrounding inflammatory changes.

Spleen: Normal in size without focal abnormality.

Adrenals/Urinary Tract: Adrenal glands are normal bilaterally.
Kidneys and ureters are within normal limits. There is no stone or
mass lesion. There is no obstruction. The urinary bladder is within
normal limits.

Stomach/Bowel: A paraesophageal hernia is noted. Stomach is
otherwise within normal limits. Duodenum is unremarkable. Small
bowel is normal. Terminal ileum is within normal limits. The
appendix is surgically absent. Ascending and transverse colon are
within normal limits. Contrast extends to the mid transverse colon.
Descending and sigmoid colon are normal.

Vascular/Lymphatic: No significant vascular findings are present. No
enlarged abdominal or pelvic lymph nodes.

Reproductive: Uterus and bilateral adnexa are unremarkable.

Other: No abdominal wall hernia or abnormality. No abdominopelvic
ascites.

Musculoskeletal: Vertebral body heights alignment are maintained. No
focal lytic or blastic lesions are present. Bony pelvis is within
normal limits. Hips are located and normal.
IMPRESSION: 1. No acute or focal lesion to explain rectal bleeding.
2. Paraesophageal hernia.

## 2020-12-31 ENCOUNTER — Ambulatory Visit (INDEPENDENT_AMBULATORY_CARE_PROVIDER_SITE_OTHER): Payer: 59 | Admitting: Psychology

## 2020-12-31 DIAGNOSIS — F411 Generalized anxiety disorder: Secondary | ICD-10-CM | POA: Diagnosis not present

## 2021-01-14 ENCOUNTER — Ambulatory Visit (INDEPENDENT_AMBULATORY_CARE_PROVIDER_SITE_OTHER): Payer: 59 | Admitting: Psychology

## 2021-01-14 DIAGNOSIS — F411 Generalized anxiety disorder: Secondary | ICD-10-CM

## 2021-01-22 ENCOUNTER — Other Ambulatory Visit: Payer: Self-pay | Admitting: Primary Care

## 2021-01-22 DIAGNOSIS — E039 Hypothyroidism, unspecified: Secondary | ICD-10-CM

## 2021-01-22 DIAGNOSIS — F418 Other specified anxiety disorders: Secondary | ICD-10-CM

## 2021-01-23 NOTE — Telephone Encounter (Signed)
Patient needs TSH rechecked as instructed in March 2022, lab appointment is fine, must have this done before we can refill. How many tablets does she have of the levothyroxine?  Also, she needs CPE in July, please schedule. Will need this for further refills.

## 2021-01-28 ENCOUNTER — Ambulatory Visit (INDEPENDENT_AMBULATORY_CARE_PROVIDER_SITE_OTHER): Payer: 59 | Admitting: Psychology

## 2021-01-28 DIAGNOSIS — F411 Generalized anxiety disorder: Secondary | ICD-10-CM

## 2021-01-29 NOTE — Telephone Encounter (Signed)
Left message to return call to our office.  

## 2021-02-03 ENCOUNTER — Other Ambulatory Visit (INDEPENDENT_AMBULATORY_CARE_PROVIDER_SITE_OTHER): Payer: Managed Care, Other (non HMO)

## 2021-02-03 ENCOUNTER — Other Ambulatory Visit: Payer: Self-pay

## 2021-02-03 DIAGNOSIS — E039 Hypothyroidism, unspecified: Secondary | ICD-10-CM

## 2021-02-04 LAB — TSH: TSH: 4.61 u[IU]/mL — ABNORMAL HIGH (ref 0.450–4.500)

## 2021-02-04 NOTE — Telephone Encounter (Signed)
Thyroid function is still out of range which would warrant a dose increase of levothyroxine to 150 mcg.   Make sure she's taking correctly, see below. If taking correctly then okay to send in levothyroxine 150 mcg. Take 1 tablet by mouth every morning on an empty stomach with water only.  No food or other medications for 30 minutes.   #90, no refills.  Will repeat TSH during CPE in July.

## 2021-02-04 NOTE — Telephone Encounter (Signed)
Left message to return call to our office.  

## 2021-02-05 ENCOUNTER — Other Ambulatory Visit: Payer: Self-pay

## 2021-02-05 MED ORDER — LEVOTHYROXINE SODIUM 150 MCG PO TABS: 150.0000 ug | ORAL_TABLET | Freq: Every day | ORAL | 0 refills | Status: DC

## 2021-02-05 NOTE — Telephone Encounter (Signed)
Noted  

## 2021-02-05 NOTE — Telephone Encounter (Signed)
Called patient she has been taking medication as directed. I have called in increased dose. She will call If any questions.

## 2021-02-05 NOTE — Telephone Encounter (Signed)
[place] Night - Client Nonclinical Telephone Record AccessNurse Client [facility] Night - Client Client Site [place] Physician [name] - NP Contact Type Call Who Is Calling Patient / Member / Family / Caregiver Caller Name Holly Singleton Caller Phone Number [phone] Call Type Message Only Information Provided Reason for Call Returning a Call from the Office Initial [place] states She is returning a phone call from the office. She thinks its about bloodwork results. Additional Comment Caller states she will also call back in the morning. Disp. Time Disposition Final User 02/04/2021 5:11:56 PM General Information Provided Yes Frasier, Jocelyn Call Closed By: Stacey Drain Transaction Date/Time: 02/04/2021 5:09:11 PM (ET)

## 2021-02-11 ENCOUNTER — Ambulatory Visit (INDEPENDENT_AMBULATORY_CARE_PROVIDER_SITE_OTHER): Payer: 59 | Admitting: Psychology

## 2021-02-11 DIAGNOSIS — F411 Generalized anxiety disorder: Secondary | ICD-10-CM

## 2021-02-25 ENCOUNTER — Ambulatory Visit (INDEPENDENT_AMBULATORY_CARE_PROVIDER_SITE_OTHER): Payer: 59 | Admitting: Psychology

## 2021-02-25 DIAGNOSIS — F411 Generalized anxiety disorder: Secondary | ICD-10-CM

## 2021-03-11 ENCOUNTER — Ambulatory Visit (INDEPENDENT_AMBULATORY_CARE_PROVIDER_SITE_OTHER): Payer: 59 | Admitting: Psychology

## 2021-03-11 DIAGNOSIS — F411 Generalized anxiety disorder: Secondary | ICD-10-CM

## 2021-03-18 ENCOUNTER — Ambulatory Visit: Payer: Managed Care, Other (non HMO) | Admitting: Primary Care

## 2021-03-18 ENCOUNTER — Other Ambulatory Visit: Payer: Self-pay

## 2021-03-18 ENCOUNTER — Encounter: Payer: Self-pay | Admitting: Primary Care

## 2021-03-18 VITALS — BP 110/68 | HR 74 | Temp 98.2°F | Ht 68.5 in | Wt 238.0 lb

## 2021-03-18 DIAGNOSIS — E039 Hypothyroidism, unspecified: Secondary | ICD-10-CM | POA: Diagnosis not present

## 2021-03-18 DIAGNOSIS — K219 Gastro-esophageal reflux disease without esophagitis: Secondary | ICD-10-CM

## 2021-03-18 DIAGNOSIS — F418 Other specified anxiety disorders: Secondary | ICD-10-CM

## 2021-03-18 DIAGNOSIS — E785 Hyperlipidemia, unspecified: Secondary | ICD-10-CM

## 2021-03-18 DIAGNOSIS — N920 Excessive and frequent menstruation with regular cycle: Secondary | ICD-10-CM

## 2021-03-18 DIAGNOSIS — Z0000 Encounter for general adult medical examination without abnormal findings: Secondary | ICD-10-CM | POA: Diagnosis not present

## 2021-03-18 DIAGNOSIS — Z1152 Encounter for screening for COVID-19: Secondary | ICD-10-CM

## 2021-03-18 NOTE — Assessment & Plan Note (Addendum)
Intermittent, less frequently as she avoids triggers.  Continue famotidine 20 mg 1-2 times weekly, continue to monitor.

## 2021-03-18 NOTE — Assessment & Plan Note (Signed)
Commended her on regular exercise and a healthy diet. Encouraged to continue.

## 2021-03-18 NOTE — Patient Instructions (Signed)
Stop by the lab prior to leaving today. I will notify you of your results once received.   It was a pleasure to see you today!  Preventive Care 18-36 Years Old, Female Preventive care refers to lifestyle choices and visits with your health care provider that can promote health and wellness. This includes: A yearly physical exam. This is also called an annual wellness visit. Regular dental and eye exams. Immunizations. Screening for certain conditions. Healthy lifestyle choices, such as: Eating a healthy diet. Getting regular exercise. Not using drugs or products that contain nicotine and tobacco. Limiting alcohol use. What can I expect for my preventive care visit? Physical exam Your health care provider may check your: Height and weight. These may be used to calculate your BMI (body mass index). BMI is a measurement that tells if you are at a healthy weight. Heart rate and blood pressure. Body temperature. Skin for abnormal spots. Counseling Your health care provider may ask you questions about your: Past medical problems. Family's medical history. Alcohol, tobacco, and drug use. Emotional well-being. Home life and relationship well-being. Sexual activity. Diet, exercise, and sleep habits. Work and work Statistician. Access to firearms. Method of birth control. Menstrual cycle. Pregnancy history. What immunizations do I need?  Vaccines are usually given at various ages, according to a schedule. Your health care provider will recommend vaccines for you based on your age, medicalhistory, and lifestyle or other factors, such as travel or where you work. What tests do I need?  Blood tests Lipid and cholesterol levels. These may be checked every 5 years starting at age 92. Hepatitis C test. Hepatitis B test. Screening Diabetes screening. This is done by checking your blood sugar (glucose) after you have not eaten for a while (fasting). STD (sexually transmitted disease)  testing, if you are at risk. BRCA-related cancer screening. This may be done if you have a family history of breast, ovarian, tubal, or peritoneal cancers. Pelvic exam and Pap test. This may be done every 3 years starting at age 75. Starting at age 18, this may be done every 5 years if you have a Pap test in combination with an HPV test. Talk with your health care provider about your test results, treatment options,and if necessary, the need for more tests. Follow these instructions at home: Eating and drinking  Eat a healthy diet that includes fresh fruits and vegetables, whole grains, lean protein, and low-fat dairy products. Take vitamin and mineral supplements as recommended by your health care provider. Do not drink alcohol if: Your health care provider tells you not to drink. You are pregnant, may be pregnant, or are planning to become pregnant. If you drink alcohol: Limit how much you have to 0-1 drink a day. Be aware of how much alcohol is in your drink. In the [place] one drink equals one 12 oz bottle of beer (355 mL), one 5 oz glass of wine (148 mL), or one 1 oz glass of hard liquor (44 mL).  Lifestyle Take daily care of your teeth and gums. Brush your teeth every morning and night with fluoride toothpaste. Floss one time each day. Stay active. Exercise for at least 30 minutes 5 or more days each week. Do not use any products that contain nicotine or tobacco, such as cigarettes, e-cigarettes, and chewing tobacco. If you need help quitting, ask your health care provider. Do not use drugs. If you are sexually active, practice safe sex. Use a condom or other form of protection to prevent  STIs (sexually transmitted infections). If you do not wish to become pregnant, use a form of birth control. If you plan to become pregnant, see your health care provider for a prepregnancy visit. Find healthy ways to cope with stress, such as: Meditation, yoga, or listening to  music. Journaling. Talking to a trusted person. Spending time with friends and family. Safety Always wear your seat belt while driving or riding in a vehicle. Do not drive: If you have been drinking alcohol. Do not ride with someone who has been drinking. When you are tired or distracted. While texting. Wear a helmet and other protective equipment during sports activities. If you have firearms in your house, make sure you follow all gun safety procedures. Seek help if you have been physically or sexually abused. What's next? Go to your health care provider once a year for an annual wellness visit. Ask your health care provider how often you should have your eyes and teeth checked. Stay up to date on all vaccines. This information is not intended to replace advice given to you by your health care provider. Make sure you discuss any questions you have with your healthcare provider. Document Revised: 04/14/2020 Document Reviewed: 04/28/2018 Elsevier Patient Education  2022 [employer].

## 2021-03-18 NOTE — Progress Notes (Signed)
Subjective:    Patient ID: Holly Singleton, female    DOB: February 19, 1986, 35 y.o.   MRN: [id number]  HPI  Holly Singleton is a very pleasant 35 y.o. female who presents today for complete physical.  Immunizations: -Tetanus: 2014 -Covid-19: 2 vaccines  Diet: Fair diet.  Exercise: Running regularly, lifting weights   Eye exam: Completes annually  Dental exam: Completes semi-annually   Pap Smear: Follows with GYN, UTD.    BP Readings from Last 3 Encounters:  03/18/21 110/68  03/22/20 118/82  12/20/19 110/74   Wt Readings from Last 3 Encounters:  03/18/21 238 lb (108 kg)  03/22/20 (!) 241 lb 8 oz (109.5 kg)  12/20/19 236 lb (107 kg)      Review of Systems  Constitutional:  Negative for unexpected weight change.  HENT:  Negative for rhinorrhea.   Eyes:  Negative for visual disturbance.  Respiratory:  Negative for shortness of breath.   Cardiovascular:  Negative for chest pain.  Gastrointestinal:  Negative for constipation and diarrhea.  Genitourinary:  Positive for menstrual problem. Negative for difficulty urinating.  Musculoskeletal:  Negative for arthralgias and myalgias.  Skin:  Negative for rash.  Allergic/Immunologic: Negative for environmental allergies.  Neurological:  Negative for dizziness, numbness and headaches.  Psychiatric/Behavioral:  The patient is not nervous/anxious.         Past Medical History:  Diagnosis Date   Anxiety    Depression    Gestational HTN 10/07/2017   Hypothyroidism    Morbid obesity ([place])    Preeclampsia, third trimester 10/07/2017    Social History   Socioeconomic History   Marital status: Married    Spouse name: Not on file   Number of children: Not on file   Years of education: Not on file   Highest education level: Not on file  Occupational History   Occupation: Theme park manager: [employer]    Comment: [employer]  Tobacco Use   Smoking status: Never   Smokeless tobacco: Never  Vaping Use   Vaping Use: Never used   Substance and Sexual Activity   Alcohol use: Yes    Comment: occasionally   Drug use: No   Sexual activity: Yes  Other Topics Concern   Not on file  Social History Narrative   Recently divorced.   Social Determinants of Health   Financial Resource Strain: Not on file  Food Insecurity: Not on file  Transportation Needs: Not on file  Physical Activity: Not on file  Stress: Not on file  Social Connections: Not on file  Intimate Partner Violence: Not on file    Past Surgical History:  Procedure Laterality Date   APPENDECTOMY      Family History  Problem Relation Age of Onset   Hypertension Mother    Lung cancer Mother 64   Hypothyroidism Mother    Hypertension Father    Autism Brother    Depression Brother     Allergies  Allergen Reactions   Lidocaine Other (See Comments)    Severe burning    Current Outpatient Medications on File Prior to Visit  Medication Sig Dispense Refill   levothyroxine (SYNTHROID) 150 MCG tablet Take 1 tablet (150 mcg total) by mouth daily. For thyroid 90 tablet 0   sertraline (ZOLOFT) 50 MG tablet TAKE 1 TABLET(50 MG) BY MOUTH DAILY FOR ANXIETY OR DEPRESSION 90 tablet 0   No current facility-administered medications on file prior to visit.    BP 110/68   Pulse  74   Temp 98.2 F (36.8 C) (Temporal)   Ht 5' 8.5" (1.74 m)   Wt 238 lb (108 kg)   LMP 03/12/2021   SpO2 97%   BMI 35.66 kg/m  Objective:   Physical Exam HENT:     Right Ear: Tympanic membrane and ear canal normal.     Left Ear: Tympanic membrane and ear canal normal.     Nose: Nose normal.  Eyes:     Conjunctiva/sclera: Conjunctivae normal.     Pupils: Pupils are equal, round, and reactive to light.  Neck:     Thyroid: No thyromegaly.  Cardiovascular:     Rate and Rhythm: Normal rate and regular rhythm.     Heart sounds: No murmur heard. Pulmonary:     Effort: Pulmonary effort is normal.     Breath sounds: Normal breath sounds. No rales.  Abdominal:      General: Bowel sounds are normal.     Palpations: Abdomen is soft.     Tenderness: There is no abdominal tenderness.  Musculoskeletal:        General: Normal range of motion.     Cervical back: Neck supple.  Lymphadenopathy:     Cervical: No cervical adenopathy.  Skin:    General: Skin is warm and dry.     Findings: No rash.  Neurological:     Mental Status: She is alert and oriented to person, place, and time.     Cranial Nerves: No cranial nerve deficit.     Deep Tendon Reflexes: Reflexes are normal and symmetric.  Psychiatric:        Mood and Affect: Mood normal.          Assessment & Plan:      This visit occurred during the SARS-CoV-2 public health emergency.  Safety protocols were in place, including screening questions prior to the visit, additional usage of staff PPE, and extensive cleaning of exam room while observing appropriate contact time as indicated for disinfecting solutions.

## 2021-03-18 NOTE — Assessment & Plan Note (Signed)
Taking levothyroxine correctly, continue 150 mcg daily. Repeat TSH level pending.

## 2021-03-18 NOTE — Assessment & Plan Note (Signed)
Doing very well on sertraline 50 mg, continue same.

## 2021-03-18 NOTE — Assessment & Plan Note (Signed)
Follows with GYN, overall improved.

## 2021-03-18 NOTE — Assessment & Plan Note (Signed)
Immunizations UTD. Pap smear UTD, follows with GYN.  Discussed the importance of a healthy diet and regular exercise in order for weight loss, and to reduce the risk of further co-morbidity.  Exam today stable. Labs pending.

## 2021-03-19 LAB — CBC
Hematocrit: 42.8 % (ref 34.0–46.6)
Hemoglobin: 14.1 g/dL (ref 11.1–15.9)
MCH: 28.7 pg (ref 26.6–33.0)
MCHC: 32.9 g/dL (ref 31.5–35.7)
MCV: 87 fL (ref 79–97)
Platelets: 435 10*3/uL (ref 150–450)
RBC: 4.91 x10E6/uL (ref 3.77–5.28)
RDW: 13.3 % (ref 11.7–15.4)
WBC: 8.9 10*3/uL (ref 3.4–10.8)

## 2021-03-19 LAB — COMPREHENSIVE METABOLIC PANEL
ALT: 17 IU/L (ref 0–32)
AST: 18 IU/L (ref 0–40)
Albumin/Globulin Ratio: 1.6 (ref 1.2–2.2)
Albumin: 4.5 g/dL (ref 3.8–4.8)
Alkaline Phosphatase: 79 IU/L (ref 44–121)
BUN/Creatinine Ratio: 20 (ref 9–23)
BUN: 18 mg/dL (ref 6–20)
Bilirubin Total: 0.4 mg/dL (ref 0.0–1.2)
CO2: 21 mmol/L (ref 20–29)
Calcium: 9.6 mg/dL (ref 8.7–10.2)
Chloride: 103 mmol/L (ref 96–106)
Creatinine, Ser: 0.92 mg/dL (ref 0.57–1.00)
Globulin, Total: 2.8 g/dL (ref 1.5–4.5)
Glucose: 82 mg/dL (ref 65–99)
Potassium: 4.9 mmol/L (ref 3.5–5.2)
Sodium: 140 mmol/L (ref 134–144)
Total Protein: 7.3 g/dL (ref 6.0–8.5)
eGFR: 83 mL/min/{1.73_m2} (ref >59)

## 2021-03-19 LAB — LIPID PANEL
Chol/HDL Ratio: 5.3 ratio — ABNORMAL HIGH (ref 0.0–4.4)
Cholesterol, Total: 244 mg/dL — ABNORMAL HIGH (ref 100–199)
HDL: 46 mg/dL (ref >39)
LDL Chol Calc (NIH): 150 mg/dL — ABNORMAL HIGH (ref 0–99)
Triglycerides: 263 mg/dL — ABNORMAL HIGH (ref 0–149)
VLDL Cholesterol Cal: 48 mg/dL — ABNORMAL HIGH (ref 5–40)

## 2021-03-19 LAB — TSH: TSH: 1.57 u[IU]/mL (ref 0.450–4.500)

## 2021-03-21 NOTE — Telephone Encounter (Signed)
Ok to refill 

## 2021-03-25 ENCOUNTER — Ambulatory Visit: Payer: 59 | Admitting: Psychology

## 2021-03-25 LAB — SARS-COV-2 SEMI-QUANTITATIVE TOTAL ANTIBODY, SPIKE: SARS-CoV-2 Spike Ab Interp: POSITIVE

## 2021-03-25 LAB — SARS-COV-2 SPIKE AB DILUTION: SARS-CoV-2 Spike Ab Dilution: >25000 U/mL (ref <0.8)

## 2021-04-09 ENCOUNTER — Ambulatory Visit (INDEPENDENT_AMBULATORY_CARE_PROVIDER_SITE_OTHER): Payer: 59 | Admitting: Psychology

## 2021-04-09 DIAGNOSIS — F411 Generalized anxiety disorder: Secondary | ICD-10-CM | POA: Diagnosis not present

## 2021-04-22 ENCOUNTER — Ambulatory Visit (INDEPENDENT_AMBULATORY_CARE_PROVIDER_SITE_OTHER): Payer: 59 | Admitting: Psychology

## 2021-04-22 DIAGNOSIS — F411 Generalized anxiety disorder: Secondary | ICD-10-CM | POA: Diagnosis not present

## 2021-05-09 ENCOUNTER — Other Ambulatory Visit: Payer: Self-pay | Admitting: Primary Care

## 2021-05-09 DIAGNOSIS — E039 Hypothyroidism, unspecified: Secondary | ICD-10-CM

## 2021-05-09 DIAGNOSIS — F418 Other specified anxiety disorders: Secondary | ICD-10-CM

## 2021-08-14 ENCOUNTER — Encounter: Payer: Self-pay | Admitting: Emergency Medicine

## 2021-08-14 ENCOUNTER — Ambulatory Visit
Admission: EM | Admit: 2021-08-14 | Discharge: 2021-08-14 | Disposition: A | Discharge status: Home / Self Care | Payer: Managed Care, Other (non HMO) | Attending: Family Medicine | Admitting: Family Medicine

## 2021-08-14 DIAGNOSIS — Z20828 Contact with and (suspected) exposure to other viral communicable diseases: Secondary | ICD-10-CM | POA: Diagnosis not present

## 2021-08-14 DIAGNOSIS — R6889 Other general symptoms and signs: Secondary | ICD-10-CM | POA: Diagnosis not present

## 2021-08-14 LAB — POCT INFLUENZA A/B
Influenza A, POC: NEGATIVE
Influenza B, POC: NEGATIVE

## 2021-08-14 MED ORDER — PROMETHAZINE-DM 6.25-15 MG/5ML PO SYRP: 5.0000 mL | ORAL_SOLUTION | Freq: Four times a day (QID) | ORAL | 0 refills | Status: DC | PRN

## 2021-08-14 MED ORDER — OSELTAMIVIR PHOSPHATE 75 MG PO CAPS: 75.0000 mg | ORAL_CAPSULE | Freq: Two times a day (BID) | ORAL | 0 refills | Status: DC

## 2021-08-14 NOTE — Discharge Instructions (Addendum)
Influenza suspected due to close exposure Continue to alternate Tylenol and ibuprofen for management of fever. Force fluids to maintain hydration. Tamiflu once daily for the next 5 days to reduce symptoms and course of influenza virus. Promethazine DM for cough.  If you develop any shortness of breath, wheezing or difficulty breathing go immediately to the nearest emergency department.

## 2021-08-14 NOTE — ED Provider Notes (Signed)
Roderic Palau    CSN: [id number] Arrival date & time: 08/14/21  1836      History   Chief Complaint Chief Complaint  Patient presents with   Flu Exposure     HPI SEANNE CHIRICO is a 35 y.o. female.   HPI Patient presents with URI symptoms including cough, sore throat, fatigue times today.  Patient also has a low-grade fever.  Both her husband and son tested positive for flu within the last 48 hours.  She denies any difficulty breathing, nausea, vomiting or diarrhea.   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 Authorizing Provider  oseltamivir (TAMIFLU) 75 MG capsule Take 1 capsule (75 mg total) by mouth 2 (two) times daily. 08/14/21  Yes Scot Jun, FNP  promethazine-dextromethorphan (PROMETHAZINE-DM) 6.25-15 MG/5ML syrup Take 5 mLs by mouth 4 (four) times daily as needed for cough. 08/14/21  Yes Scot Jun, FNP  levothyroxine (SYNTHROID) 150 MCG tablet Take 1 tablet by mouth every morning on an empty stomach with water only.  No food or other medications for 30 minutes. 05/09/21   Pleas Koch, NP  sertraline (ZOLOFT) 50 MG tablet TAKE 1 TABLET(50 MG) BY MOUTH DAILY FOR ANXIETY OR DEPRESSION 05/09/21   Pleas Koch, NP    Family History Family History  Problem Relation Age of Onset   Hypertension  Mother    Lung cancer Mother 40   Hypothyroidism Mother    Hypertension Father    Autism Brother    Depression Brother     Social History Social History   Tobacco Use   Smoking status: Never   Smokeless tobacco: Never  Vaping Use   Vaping Use: Never used  Substance Use Topics   Alcohol use: Yes    Comment: occasionally   Drug use: No     Allergies   Lidocaine   Review of Systems Review of Systems   Physical Exam Triage Vital Signs ED Triage Vitals  Enc Vitals Group     BP 08/14/21 1920 122/68     Pulse Rate 08/14/21 1920 62     Resp 08/14/21 1920 16     Temp 08/14/21 1920 99 F (37.2 C)     Temp Source 08/14/21 1920 Oral     SpO2 08/14/21 1920 96 %     Weight --      Height --      Head Circumference --      Peak Flow --      Pain Score 08/14/21 1919 0     Pain Loc --  Pain Edu? --      Excl. in [place]? --    No data found.  Updated Vital Signs BP 122/68 (BP Location: Left Arm)    Pulse 62    Temp 99 F (37.2 C) (Oral)    Resp 16    LMP 08/13/2021    SpO2 96%   Visual Acuity Right Eye Distance:   Left Eye Distance:   Bilateral Distance:    Right Eye Near:   Left Eye Near:    Bilateral Near:     Physical Exam  General Appearance:    Alert, acutely ill, cooperative, no distress  HENT:   Normocephalic, ears normal, nares mucosal edema with congestion, rhinorrhea, oropharynx  patent   Eyes:    PERRL, conjunctiva/corneas clear, EOM's intact       Lungs:     Clear to auscultation bilaterally, respirations unlabored  Heart:    Regular rate and rhythm  Neurologic:   Awake, alert, oriented x 3. No apparent focal neurological           defect.      UC Treatments / Results  Labs (all labs ordered are listed, but only abnormal results are displayed) Labs Reviewed - No data to display  EKG   Radiology No results found.  Procedures Procedures (including critical care time)  Medications Ordered in UC Medications - No data to  display  Initial Impression / Assessment and Plan / UC Course  I have reviewed the triage vital signs and the nursing notes.  Pertinent labs & imaging results that were available during my care of the patient were reviewed by me and considered in my medical decision making (see chart for details).    Rapid flu negative however given patient's close exposure  will start patient on Tamiflu treatment dose.  Promethazine DM for cough.  Tylenol and ibuprofen for fever or body aches.  Strict return precautions if symptoms worsen or do not readily improve. Final Clinical Impressions(s) / UC Diagnoses   Final diagnoses:  Exposure to the flu  Flu-like symptoms     Discharge Instructions      Influenza suspected due to close exposure Continue to alternate Tylenol and ibuprofen for management of fever. Force fluids to maintain hydration. Tamiflu once daily for the next 5 days to reduce symptoms and course of influenza virus. Promethazine DM for cough.  If you develop any shortness of breath, wheezing or difficulty breathing go immediately to the nearest emergency department.    ED Prescriptions     Medication Sig Dispense Auth. Provider   oseltamivir (TAMIFLU) 75 MG capsule Take 1 capsule (75 mg total) by mouth 2 (two) times daily. 10 capsule Scot Jun, FNP   promethazine-dextromethorphan (PROMETHAZINE-DM) 6.25-15 MG/5ML syrup Take 5 mLs by mouth 4 (four) times daily as needed for cough. 180 mL Scot Jun, FNP      PDMP not reviewed this encounter.   Scot Jun, FNP 08/14/21 2002

## 2021-08-14 NOTE — ED Triage Notes (Signed)
Pt presents with cough, scratchy throat that started today. Her husband and son has been dx with flu.

## 2022-03-24 ENCOUNTER — Other Ambulatory Visit: Payer: Self-pay | Admitting: Primary Care

## 2022-03-24 DIAGNOSIS — E039 Hypothyroidism, unspecified: Secondary | ICD-10-CM

## 2022-03-24 DIAGNOSIS — E785 Hyperlipidemia, unspecified: Secondary | ICD-10-CM

## 2022-04-01 ENCOUNTER — Other Ambulatory Visit: Payer: Managed Care, Other (non HMO)

## 2022-04-08 ENCOUNTER — Encounter: Payer: Self-pay | Admitting: Primary Care

## 2022-04-08 ENCOUNTER — Ambulatory Visit (INDEPENDENT_AMBULATORY_CARE_PROVIDER_SITE_OTHER): Payer: Managed Care, Other (non HMO) | Admitting: Primary Care

## 2022-04-08 VITALS — BP 110/68 | HR 64 | Temp 98.6°F | Ht 68.5 in | Wt 257.0 lb

## 2022-04-08 DIAGNOSIS — E039 Hypothyroidism, unspecified: Secondary | ICD-10-CM

## 2022-04-08 DIAGNOSIS — Z0001 Encounter for general adult medical examination with abnormal findings: Secondary | ICD-10-CM | POA: Diagnosis not present

## 2022-04-08 DIAGNOSIS — E785 Hyperlipidemia, unspecified: Secondary | ICD-10-CM | POA: Diagnosis not present

## 2022-04-08 DIAGNOSIS — F418 Other specified anxiety disorders: Secondary | ICD-10-CM | POA: Diagnosis not present

## 2022-04-08 MED ORDER — SERTRALINE HCL 25 MG PO TABS: 25.0000 mg | ORAL_TABLET | Freq: Every day | ORAL | 1 refills | Status: DC

## 2022-04-08 NOTE — Assessment & Plan Note (Signed)
Deteriorated given increased stress of the last year.  Commended her on connecting with therapy, continue weekly session. Increase dose of sertraline to 75 mg, continue 50 mg dose, add 25 mg tablet.   She will update if no improvement.

## 2022-04-08 NOTE — Assessment & Plan Note (Signed)
Discussed the importance of a healthy diet and regular exercise in order for weight loss, and to reduce the risk of further co-morbidity.  Repeat lipid panel pending. 

## 2022-04-08 NOTE — Patient Instructions (Signed)
Stop by the lab prior to leaving today. I will notify you of your results once received.   We increased your sertraline to 75 mg daily.  Continue taking sertraline 50 mg daily, start taking sertraline 25 mg daily.  Continue follow-up with your therapist.  It was a pleasure to see you today!  Preventive Care 58-37 Years Old, Female Preventive care refers to lifestyle choices and visits with your health care provider that can promote health and wellness. Preventive care visits are also called wellness exams. What can I expect for my preventive care visit? Counseling During your preventive care visit, your health care provider may ask about your: Medical history, including: Past medical problems. Family medical history. Pregnancy history. Current health, including: Menstrual cycle. Method of birth control. Emotional well-being. Home life and relationship well-being. Sexual activity and sexual health. Lifestyle, including: Alcohol, nicotine or tobacco, and drug use. Access to firearms. Diet, exercise, and sleep habits. Work and work Statistician. Sunscreen use. Safety issues such as seatbelt and bike helmet use. Physical exam Your health care provider may check your: Height and weight. These may be used to calculate your BMI (body mass index). BMI is a measurement that tells if you are at a healthy weight. Waist circumference. This measures the distance around your waistline. This measurement also tells if you are at a healthy weight and may help predict your risk of certain diseases, such as type 2 diabetes and high blood pressure. Heart rate and blood pressure. Body temperature. Skin for abnormal spots. What immunizations do I need?  Vaccines are usually given at various ages, according to a schedule. Your health care provider will recommend vaccines for you based on your age, medical history, and lifestyle or other factors, such as travel or where you work. What tests do I  need? Screening Your health care provider may recommend screening tests for certain conditions. This may include: Pelvic exam and Pap test. Lipid and cholesterol levels. Diabetes screening. This is done by checking your blood sugar (glucose) after you have not eaten for a while (fasting). Hepatitis B test. Hepatitis C test. HIV (human immunodeficiency virus) test. STI (sexually transmitted infection) testing, if you are at risk. BRCA-related cancer screening. This may be done if you have a family history of breast, ovarian, tubal, or peritoneal cancers. Talk with your health care provider about your test results, treatment options, and if necessary, the need for more tests. Follow these instructions at home: Eating and drinking  Eat a healthy diet that includes fresh fruits and vegetables, whole grains, lean protein, and low-fat dairy products. Take vitamin and mineral supplements as recommended by your health care provider. Do not drink alcohol if: Your health care provider tells you not to drink. You are pregnant, may be pregnant, or are planning to become pregnant. If you drink alcohol: Limit how much you have to 0-1 drink a day. Know how much alcohol is in your drink. In the [place] one drink equals one 12 oz bottle of beer (355 mL), one 5 oz glass of wine (148 mL), or one 1 oz glass of hard liquor (44 mL). Lifestyle Brush your teeth every morning and night with fluoride toothpaste. Floss one time each day. Exercise for at least 30 minutes 5 or more days each week. Do not use any products that contain nicotine or tobacco. These products include cigarettes, chewing tobacco, and vaping devices, such as e-cigarettes. If you need help quitting, ask your health care provider. Do not use drugs. If  you are sexually active, practice safe sex. Use a condom or other form of protection to prevent STIs. If you do not wish to become pregnant, use a form of birth control. If you plan to become  pregnant, see your health care provider for a prepregnancy visit. Find healthy ways to manage stress, such as: Meditation, yoga, or listening to music. Journaling. Talking to a trusted person. Spending time with friends and family. Minimize exposure to UV radiation to reduce your risk of skin cancer. Safety Always wear your seat belt while driving or riding in a vehicle. Do not drive: If you have been drinking alcohol. Do not ride with someone who has been drinking. If you have been using any mind-altering substances or drugs. While texting. When you are tired or distracted. Wear a helmet and other protective equipment during sports activities. If you have firearms in your house, make sure you follow all gun safety procedures. Seek help if you have been physically or sexually abused. What's next? Go to your health care provider once a year for an annual wellness visit. Ask your health care provider how often you should have your eyes and teeth checked. Stay up to date on all vaccines. This information is not intended to replace advice given to you by your health care provider. Make sure you discuss any questions you have with your health care provider. Document Revised: 02/12/2021 Document Reviewed: 02/12/2021 Elsevier Patient Education  [place].

## 2022-04-08 NOTE — Assessment & Plan Note (Signed)
She is taking levothyroxine correctly.  Continue levothyroxine 150 mcg daily. Repeat TSH pending.

## 2022-04-08 NOTE — Progress Notes (Signed)
Subjective:    Patient ID: Holly Singleton, female    DOB: [birth year], 36 y.o.   MRN: [id number]  HPI  Holly Singleton is a very pleasant 36 y.o. female who presents today for complete physical and follow up of chronic conditions.  She would also like to discuss ongoing depression and anxiety. She's been under a lot of stress over the last year. She recently re-connected with a therapist, has has one meeting thus far, is scheduled again for tomorrow. She is currently managed on sertraline 50 mg daily for which she has been taking for several years. Historically has done well on this regimen until recently. Symptoms include feeling down, tearfulness, worrying, little motivation to do things with her family, feeling stressed, difficulty concentrating at work.   Immunizations: -Tetanus: 2014 -Influenza: Due this season  -Covid-19: 4-5 vaccines  Diet: Fair diet.  Exercise: No regular exercise.  Eye exam: Completes every other year.   Dental exam: Completes semi-annually   Pap Smear: Completed per GYN.  BP Readings from Last 3 Encounters:  04/08/22 110/68  08/14/21 122/68  03/18/21 110/68      Review of Systems  Constitutional:  Negative for unexpected weight change.  HENT:  Negative for rhinorrhea.   Eyes:  Negative for visual disturbance.  Respiratory:  Negative for cough and shortness of breath.   Cardiovascular:  Negative for chest pain.  Gastrointestinal:  Negative for constipation and diarrhea.  Genitourinary:  Negative for difficulty urinating and menstrual problem.  Musculoskeletal:  Negative for arthralgias and myalgias.  Skin:  Negative for rash.  Allergic/Immunologic: Negative for environmental allergies.  Neurological:  Negative for dizziness and headaches.  Psychiatric/Behavioral:  The patient is nervous/anxious.          Past Medical History:  Diagnosis Date   Anxiety    Depression    Gestational HTN 10/07/2017   Hypothyroidism    Morbid obesity ([place])     Preeclampsia, third trimester 10/07/2017    Social History   Socioeconomic History   Marital status: Married    Spouse name: Not on file   Number of children: Not on file   Years of education: Not on file   Highest education level: Not on file  Occupational History   Occupation: Theme park manager: [employer]    Comment: [employer]  Tobacco Use   Smoking status: Never   Smokeless tobacco: Never  Vaping Use   Vaping Use: Never used  Substance and Sexual Activity   Alcohol use: Yes    Comment: occasionally   Drug use: No   Sexual activity: Yes  Other Topics Concern   Not on file  Social History Narrative   Recently divorced.   Social Determinants of Health   Financial Resource Strain: Not on file  Food Insecurity: Not on file  Transportation Needs: Not on file  Physical Activity: Not on file  Stress: Not on file  Social Connections: Not on file  Intimate Partner Violence: Not on file    Past Surgical History:  Procedure Laterality Date   APPENDECTOMY      Family History  Problem Relation Age of Onset   Hypertension Mother    Lung cancer Mother 22   Hypothyroidism Mother    Hypertension Father    Autism Brother    Depression Brother     Allergies  Allergen Reactions   Lidocaine Other (See Comments)    Severe burning    Current Outpatient Medications on File Prior to  Visit  Medication Sig Dispense Refill   levothyroxine (SYNTHROID) 150 MCG tablet Take 1 tablet by mouth every morning on an empty stomach with water only.  No food or other medications for 30 minutes. 90 tablet 3   sertraline (ZOLOFT) 50 MG tablet TAKE 1 TABLET(50 MG) BY MOUTH DAILY FOR ANXIETY OR DEPRESSION 90 tablet 3   No current facility-administered medications on file prior to visit.    BP 110/68   Pulse 64   Temp 98.6 F (37 C) (Oral)   Ht 5' 8.5" (1.74 m)   Wt 257 lb (116.6 kg)   LMP 03/05/2022   SpO2 98%   BMI 38.51 kg/m  Objective:   Physical Exam HENT:     Right  Ear: Tympanic membrane and ear canal normal.     Left Ear: Tympanic membrane and ear canal normal.     Nose: Nose normal.  Eyes:     Conjunctiva/sclera: Conjunctivae normal.     Pupils: Pupils are equal, round, and reactive to light.  Neck:     Thyroid: No thyromegaly.  Cardiovascular:     Rate and Rhythm: Normal rate and regular rhythm.     Heart sounds: No murmur heard. Pulmonary:     Effort: Pulmonary effort is normal.     Breath sounds: Normal breath sounds. No rales.  Abdominal:     General: Bowel sounds are normal.     Palpations: Abdomen is soft.     Tenderness: There is no abdominal tenderness.  Musculoskeletal:        General: Normal range of motion.     Cervical back: Neck supple.  Lymphadenopathy:     Cervical: No cervical adenopathy.  Skin:    General: Skin is warm and dry.     Findings: No rash.  Neurological:     Mental Status: She is alert and oriented to person, place, and time.     Cranial Nerves: No cranial nerve deficit.     Deep Tendon Reflexes: Reflexes are normal and symmetric.  Psychiatric:        Mood and Affect: Mood normal.     Comments: Slightly tearful during visit           Assessment & Plan:   Problem List Items Addressed This Visit       Endocrine   Hypothyroidism    She is taking levothyroxine correctly.  Continue levothyroxine 150 mcg daily. Repeat TSH pending.      Relevant Orders   TSH     Other   HLD (hyperlipidemia)    Discussed the importance of a healthy diet and regular exercise in order for weight loss, and to reduce the risk of further co-morbidity.  Repeat lipid panel pending.      Relevant Orders   Lipid panel   CBC   Comprehensive metabolic panel   Depression with anxiety    Deteriorated given increased stress of the last year.  Commended her on connecting with therapy, continue weekly session. Increase dose of sertraline to 75 mg, continue 50 mg dose, add 25 mg tablet.   She will update if no  improvement.       Relevant Medications   sertraline (ZOLOFT) 25 MG tablet   Encounter for annual general medical examination with abnormal findings in adult - Primary    Immunizations UTD. Pap smear UTD.  Discussed the importance of a healthy diet and regular exercise in order for weight loss, and to reduce the risk of further co-morbidity.  Labs pending.  Follow up in 1 year for repeat physical.           Pleas Koch, NP

## 2022-04-08 NOTE — Assessment & Plan Note (Signed)
Immunizations UTD. Pap smear UTD.  Discussed the importance of a healthy diet and regular exercise in order for weight loss, and to reduce the risk of further co-morbidity.  Labs pending.  Follow up in 1 year for repeat physical.

## 2022-04-09 LAB — COMPREHENSIVE METABOLIC PANEL
ALT: 20 IU/L (ref 0–32)
AST: 21 IU/L (ref 0–40)
Albumin/Globulin Ratio: 1.6 (ref 1.2–2.2)
Albumin: 4.4 g/dL (ref 3.9–4.9)
Alkaline Phosphatase: 74 IU/L (ref 44–121)
BUN/Creatinine Ratio: 18 (ref 9–23)
BUN: 15 mg/dL (ref 6–20)
Bilirubin Total: 0.4 mg/dL (ref 0.0–1.2)
CO2: 20 mmol/L (ref 20–29)
Calcium: 9.2 mg/dL (ref 8.7–10.2)
Chloride: 103 mmol/L (ref 96–106)
Creatinine, Ser: 0.82 mg/dL (ref 0.57–1.00)
Globulin, Total: 2.7 g/dL (ref 1.5–4.5)
Glucose: 86 mg/dL (ref 70–99)
Potassium: 4.3 mmol/L (ref 3.5–5.2)
Sodium: 139 mmol/L (ref 134–144)
Total Protein: 7.1 g/dL (ref 6.0–8.5)
eGFR: 94 mL/min/{1.73_m2} (ref >59)

## 2022-04-09 LAB — LIPID PANEL
Chol/HDL Ratio: 5.6 ratio — ABNORMAL HIGH (ref 0.0–4.4)
Cholesterol, Total: 235 mg/dL — ABNORMAL HIGH (ref 100–199)
HDL: 42 mg/dL (ref >39)
LDL Chol Calc (NIH): 166 mg/dL — ABNORMAL HIGH (ref 0–99)
Triglycerides: 149 mg/dL (ref 0–149)
VLDL Cholesterol Cal: 27 mg/dL (ref 5–40)

## 2022-04-09 LAB — CBC
Hematocrit: 40.2 % (ref 34.0–46.6)
Hemoglobin: 13.9 g/dL (ref 11.1–15.9)
MCH: 28.8 pg (ref 26.6–33.0)
MCHC: 34.6 g/dL (ref 31.5–35.7)
MCV: 83 fL (ref 79–97)
Platelets: 382 10*3/uL (ref 150–450)
RBC: 4.82 x10E6/uL (ref 3.77–5.28)
RDW: 13 % (ref 11.7–15.4)
WBC: 6.7 10*3/uL (ref 3.4–10.8)

## 2022-04-09 LAB — TSH: TSH: 1.44 u[IU]/mL (ref 0.450–4.500)

## 2022-05-01 ENCOUNTER — Other Ambulatory Visit: Payer: Self-pay | Admitting: Primary Care

## 2022-05-01 DIAGNOSIS — F418 Other specified anxiety disorders: Secondary | ICD-10-CM

## 2022-05-01 DIAGNOSIS — E039 Hypothyroidism, unspecified: Secondary | ICD-10-CM

## 2022-07-31 LAB — HM PAP SMEAR: HPV, high-risk: NEGATIVE

## 2022-11-11 DIAGNOSIS — F418 Other specified anxiety disorders: Secondary | ICD-10-CM

## 2022-11-12 MED ORDER — SERTRALINE HCL 25 MG PO TABS: 25.0000 mg | ORAL_TABLET | Freq: Every day | ORAL | 1 refills | Status: DC

## 2023-03-31 ENCOUNTER — Encounter (INDEPENDENT_AMBULATORY_CARE_PROVIDER_SITE_OTHER): Payer: Self-pay

## 2023-04-16 ENCOUNTER — Ambulatory Visit (INDEPENDENT_AMBULATORY_CARE_PROVIDER_SITE_OTHER): Payer: Managed Care, Other (non HMO) | Admitting: Primary Care

## 2023-04-16 VITALS — BP 120/86 | HR 82 | Temp 98.1°F | Ht 68.5 in | Wt 264.2 lb

## 2023-04-16 DIAGNOSIS — F418 Other specified anxiety disorders: Secondary | ICD-10-CM | POA: Diagnosis not present

## 2023-04-16 DIAGNOSIS — K219 Gastro-esophageal reflux disease without esophagitis: Secondary | ICD-10-CM

## 2023-04-16 DIAGNOSIS — Z23 Encounter for immunization: Secondary | ICD-10-CM

## 2023-04-16 DIAGNOSIS — E039 Hypothyroidism, unspecified: Secondary | ICD-10-CM

## 2023-04-16 DIAGNOSIS — E785 Hyperlipidemia, unspecified: Secondary | ICD-10-CM | POA: Diagnosis not present

## 2023-04-16 DIAGNOSIS — Z0000 Encounter for general adult medical examination without abnormal findings: Secondary | ICD-10-CM | POA: Diagnosis not present

## 2023-04-16 NOTE — Addendum Note (Signed)
Addended by: Aundra Millet on: 04/16/2023 10:37 AM   Modules accepted: Orders

## 2023-04-16 NOTE — Assessment & Plan Note (Signed)
Overall infrequent.  Continue Pepcid 20 mg PRN

## 2023-04-16 NOTE — Progress Notes (Signed)
Subjective:    Patient ID: Holly Singleton, female    DOB: 1986/08/03, 37 y.o.   MRN: [id number]  HPI  Holly Singleton is a very pleasant 37 y.o. female who presents today for complete physical and follow up of chronic conditions.  Immunizations: -Tetanus: Completed in 2014, due today  Diet: Fair diet.  Exercise: No regular exercise.  Eye exam: Completed 2 years ago Dental exam: Completes semi-annually    Pap Smear: UTD, follows with GYN  BP Readings from Last 3 Encounters:  04/16/23 120/86  04/08/22 110/68  08/14/21 122/68         Review of Systems  Constitutional:  Negative for unexpected weight change.  HENT:  Negative for rhinorrhea.   Respiratory:  Negative for cough and shortness of breath.   Cardiovascular:  Negative for chest pain.  Gastrointestinal:  Negative for constipation and diarrhea.  Genitourinary:  Negative for difficulty urinating and menstrual problem.  Musculoskeletal:  Negative for arthralgias and myalgias.  Skin:  Negative for rash.  Allergic/Immunologic: Negative for environmental allergies.  Neurological:  Negative for dizziness and headaches.  Psychiatric/Behavioral:  The patient is not nervous/anxious.          Past Medical History:  Diagnosis Date   Anxiety    Depression    Gestational HTN 10/07/2017   Hypothyroidism    Morbid obesity ([facility])    Preeclampsia, third trimester 10/07/2017    Social History   Socioeconomic History   Marital status: Married    Spouse name: Not on file   Number of children: Not on file   Years of education: Not on file   Highest education level: Not on file  Occupational History   Occupation: Electronics engineer: [employer]    Comment: [employer]  Tobacco Use   Smoking status: Never   Smokeless tobacco: Never  Vaping Use   Vaping status: Never Used  Substance and Sexual Activity   Alcohol use: Yes    Comment: occasionally   Drug use: No   Sexual activity: Yes  Other Topics Concern   Not on  file  Social History Narrative   Recently divorced.   Social Determinants of Health   Financial Resource Strain: Not on file  Food Insecurity: Not on file  Transportation Needs: Not on file  Physical Activity: Not on file  Stress: Not on file  Social Connections: Not on file  Intimate Partner Violence: Not on file    Past Surgical History:  Procedure Laterality Date   APPENDECTOMY      Family History  Problem Relation Age of Onset   Hypertension Mother    Lung cancer Mother 49   Hypothyroidism Mother    Hypertension Father    Autism Brother    Depression Brother     Allergies  Allergen Reactions   Lidocaine Other (See Comments)    Severe burning    Current Outpatient Medications on File Prior to Visit  Medication Sig Dispense Refill   levothyroxine (SYNTHROID) 150 MCG tablet TAKE 1 TABLET BY MOUTH EVERY MORNING, ON AN EMPTY STOMACH WITH WATER ONLY, NO FOOD OR OTHER MEDICATIONS FOR 30 MINUTES 90 tablet 2   sertraline (ZOLOFT) 25 MG tablet Take 1 tablet (25 mg total) by mouth daily. for anxiety and depression. Take with 50 mg tablet. 90 tablet 1   sertraline (ZOLOFT) 50 MG tablet TAKE 1 TABLET(50 MG) BY MOUTH DAILY FOR ANXIETY OR DEPRESSION 90 tablet 2   No current facility-administered medications on  file prior to visit.    BP 120/86 (BP Location: Left Arm, Patient Position: Sitting, Cuff Size: Normal)   Pulse 82   Temp 98.1 F (36.7 C) (Oral)   Ht 5' 8.5" (1.74 m)   Wt 264 lb 3.2 oz (119.8 kg)   SpO2 98%   BMI 39.59 kg/m  Objective:   Physical Exam HENT:     Right Ear: Tympanic membrane and ear canal normal.     Left Ear: Tympanic membrane and ear canal normal.     Nose: Nose normal.  Eyes:     Conjunctiva/sclera: Conjunctivae normal.     Pupils: Pupils are equal, round, and reactive to light.  Neck:     Thyroid: No thyromegaly.  Cardiovascular:     Rate and Rhythm: Normal rate and regular rhythm.     Heart sounds: No murmur heard. Pulmonary:      Effort: Pulmonary effort is normal.     Breath sounds: Normal breath sounds. No rales.  Abdominal:     General: Bowel sounds are normal.     Palpations: Abdomen is soft.     Tenderness: There is no abdominal tenderness.  Musculoskeletal:        General: Normal range of motion.     Cervical back: Neck supple.  Lymphadenopathy:     Cervical: No cervical adenopathy.  Skin:    General: Skin is warm and dry.     Findings: No rash.  Neurological:     Mental Status: She is alert and oriented to person, place, and time.     Cranial Nerves: No cranial nerve deficit.     Deep Tendon Reflexes: Reflexes are normal and symmetric.  Psychiatric:        Mood and Affect: Mood normal.           Assessment & Plan:  Preventative health care Assessment & Plan: Tetanus vaccine provided today. Pap smear UTD.  Discussed the importance of a healthy diet and regular exercise in order for weight loss, and to reduce the risk of further co-morbidity.  Exam stable. Labs pending.  Follow up in 1 year for repeat physical.    Hypothyroidism, unspecified type Assessment & Plan: She is taking levothyroxine correctly.  Continue levothyroxine 150 mcg daily. Repeat TSH pending.  Orders: -     TSH -     CBC  Depression with anxiety Assessment & Plan: Increased over the last year with personal stress. Stable and no concerns today.  Continue Zoloft 75 mg daily. Continue with therapy.   Hyperlipidemia, unspecified hyperlipidemia type Assessment & Plan: Repeat lipid panel pending.  Discussed the importance of a healthy diet and regular exercise in order for weight loss, and to reduce the risk of further co-morbidity.  Previously managed on statin therapy.  Orders: -     Lipid panel -     Hemoglobin A1c -     Comprehensive metabolic panel -     CBC  Gastroesophageal reflux disease, unspecified whether esophagitis present Assessment & Plan: Overall infrequent.  Continue Pepcid 20  mg PRN         Doreene Nest, NP

## 2023-04-16 NOTE — Assessment & Plan Note (Signed)
Tetanus vaccine provided today. Pap smear UTD.  Discussed the importance of a healthy diet and regular exercise in order for weight loss, and to reduce the risk of further co-morbidity.  Exam stable. Labs pending.  Follow up in 1 year for repeat physical.

## 2023-04-16 NOTE — Assessment & Plan Note (Signed)
She is taking levothyroxine correctly.  Continue levothyroxine 150 mcg daily. Repeat TSH pending.

## 2023-04-16 NOTE — Assessment & Plan Note (Addendum)
Repeat lipid panel pending.  Discussed the importance of a healthy diet and regular exercise in order for weight loss, and to reduce the risk of further co-morbidity.  Previously managed on statin therapy.

## 2023-04-16 NOTE — Patient Instructions (Signed)
Stop by the lab prior to leaving today. I will notify you of your results once received.   It was a pleasure to see you today!  

## 2023-04-16 NOTE — Assessment & Plan Note (Signed)
Increased over the last year with personal stress. Stable and no concerns today.  Continue Zoloft 75 mg daily. Continue with therapy.

## 2023-04-17 LAB — LIPID PANEL
Chol/HDL Ratio: 5.8 ratio — ABNORMAL HIGH (ref 0.0–4.4)
Cholesterol, Total: 243 mg/dL — ABNORMAL HIGH (ref 100–199)
HDL: 42 mg/dL (ref >39)
LDL Chol Calc (NIH): 174 mg/dL — ABNORMAL HIGH (ref 0–99)
Triglycerides: 146 mg/dL (ref 0–149)
VLDL Cholesterol Cal: 27 mg/dL (ref 5–40)

## 2023-04-17 LAB — COMPREHENSIVE METABOLIC PANEL
ALT: 25 IU/L (ref 0–32)
AST: 25 IU/L (ref 0–40)
Albumin: 4.3 g/dL (ref 3.9–4.9)
Alkaline Phosphatase: 79 IU/L (ref 44–121)
BUN/Creatinine Ratio: 15 (ref 9–23)
BUN: 12 mg/dL (ref 6–20)
Bilirubin Total: 0.4 mg/dL (ref 0.0–1.2)
CO2: 20 mmol/L (ref 20–29)
Calcium: 9.3 mg/dL (ref 8.7–10.2)
Chloride: 106 mmol/L (ref 96–106)
Creatinine, Ser: 0.79 mg/dL (ref 0.57–1.00)
Globulin, Total: 2.8 g/dL (ref 1.5–4.5)
Glucose: 91 mg/dL (ref 70–99)
Potassium: 4.6 mmol/L (ref 3.5–5.2)
Sodium: 141 mmol/L (ref 134–144)
Total Protein: 7.1 g/dL (ref 6.0–8.5)
eGFR: 98 mL/min/{1.73_m2} (ref >59)

## 2023-04-17 LAB — CBC
Hematocrit: 41.1 % (ref 34.0–46.6)
Hemoglobin: 13.3 g/dL (ref 11.1–15.9)
MCH: 26.7 pg (ref 26.6–33.0)
MCHC: 32.4 g/dL (ref 31.5–35.7)
MCV: 82 fL (ref 79–97)
Platelets: 448 10*3/uL (ref 150–450)
RBC: 4.99 x10E6/uL (ref 3.77–5.28)
RDW: 13.7 % (ref 11.7–15.4)
WBC: 8.4 10*3/uL (ref 3.4–10.8)

## 2023-04-17 LAB — HEMOGLOBIN A1C
Est. average glucose Bld gHb Est-mCnc: 105 mg/dL
Hgb A1c MFr Bld: 5.3 % (ref 4.8–5.6)

## 2023-04-17 LAB — TSH: TSH: 2.15 u[IU]/mL (ref 0.450–4.500)

## 2023-04-21 NOTE — Telephone Encounter (Signed)
Form signed and placed in Holly Singleton's inbox.  We just need her waist measurement for which I have asked her to provide.

## 2023-04-21 NOTE — Telephone Encounter (Signed)
Have sent message to patient to get her to send up her waist measurement. Form placed in your box for review.

## 2023-04-22 NOTE — Telephone Encounter (Signed)
Did not get measurement. Have faxed without and sent to scan if patient calls back we can pull and resend.

## 2023-04-22 NOTE — Telephone Encounter (Signed)
Joellen, can you add this to her form? It is laying on my computer in my office. I already signed it so it's ready to go.

## 2023-05-04 ENCOUNTER — Other Ambulatory Visit: Payer: Self-pay | Admitting: Primary Care

## 2023-05-04 DIAGNOSIS — F418 Other specified anxiety disorders: Secondary | ICD-10-CM

## 2023-05-20 ENCOUNTER — Ambulatory Visit
Admission: RE | Admit: 2023-05-20 | Discharge: 2023-05-20 | Disposition: A | Discharge status: Home / Self Care | Payer: Managed Care, Other (non HMO) | Source: Ambulatory Visit | Attending: Family Medicine | Admitting: Family Medicine

## 2023-05-20 VITALS — BP 136/81 | HR 76 | Temp 99.1°F | Resp 18

## 2023-05-20 DIAGNOSIS — U071 COVID-19: Secondary | ICD-10-CM

## 2023-05-20 MED ORDER — BENZONATATE 100 MG PO CAPS: 100.0000 mg | ORAL_CAPSULE | Freq: Three times a day (TID) | ORAL | 0 refills | Status: DC | PRN

## 2023-05-20 MED ORDER — NIRMATRELVIR/RITONAVIR (PAXLOVID)TABLET: ORAL_TABLET | ORAL | 0 refills | Status: DC

## 2023-05-20 NOTE — Discharge Instructions (Signed)
Take nirmatrelvir 150 mg --2 tablets twice daily for 5 days, plus also ritonavir 100 mg--1 tablet twice daily for 5 days.  These are antiviral medicines, meant to keep you from getting worse with a COVID-19 infection  Take benzonatate 100 mg, 1 tab every 8 hours as needed for cough.

## 2023-05-20 NOTE — ED Provider Notes (Signed)
Renaldo Fiddler    CSN: [id number] Arrival date & time: 05/20/23  1221      History   Chief Complaint Chief Complaint  Patient presents with   Cough    Tested positive for COVID on Monday via an at home test, has not improved since. I (husband) tested negative but likely have it as well, but I have bounced back quickly. Very worried about her and think she needs Paxlovid, but need a Dr to see her - Entered by patient    HPI Holly Singleton is a 37 y.o. female.    Cough Here for cough and nasal congestion and myalgia and malaise.  Symptoms began on September 16.  Then on September 18 she felt worse with more intense symptoms.  She did a home COVID test that was positive that day.  Now today she is feeling more burning in her chest and in her throat.  She has also had some headache.  She denies any wheezing or tightness in her chest.  She has maybe felt a little short of breath when she is walking about; in other words she feels more fatigued and short of breath.  No nausea vomiting or diarrhea.  She does not have a history of asthma    Past Medical History:  Diagnosis Date   Anxiety    Depression    Gestational HTN 10/07/2017   Hypothyroidism    Morbid obesity (HCC)    Preeclampsia, third trimester 10/07/2017    Patient Active Problem List   Diagnosis Date Noted   GERD (gastroesophageal reflux disease) 03/22/2020   Preventative health care 03/16/2019   Depression with anxiety 02/28/2018   Contraception management 06/05/2015   Heavy menses 04/19/2013   HLD (hyperlipidemia) 01/06/2010   NEVI, MULTIPLE 12/16/2009   Hypothyroidism 06/17/2007    Past Surgical History:  Procedure Laterality Date   APPENDECTOMY      OB History     Gravida  1   Para  1   Term  1   Preterm  0   AB  0   Living  1      SAB  0   IAB  0   Ectopic  0   Multiple  0   Live Births  1            Home Medications    Prior to Admission medications   Medication  Sig Start Date End Date Taking? Authorizing Provider  benzonatate (TESSALON) 100 MG capsule Take 1 capsule (100 mg total) by mouth 3 (three) times daily as needed for cough. 05/20/23  Yes Zenia Resides, MD  nirmatrelvir/ritonavir (PAXLOVID) 20 x 150 MG & 10 x 100MG  TABS Patient GFR is 98. Take nirmatrelvir (150 mg) two tablets twice daily for 5 days and ritonavir (100 mg) one tablet twice daily for 5 days. 05/20/23  Yes Stephan Draughn, Janace Aris, MD  levothyroxine (SYNTHROID) 150 MCG tablet TAKE 1 TABLET BY MOUTH EVERY MORNING, ON AN EMPTY STOMACH WITH WATER ONLY, NO FOOD OR OTHER MEDICATIONS FOR 30 MINUTES 05/01/22   Doreene Nest, NP  sertraline (ZOLOFT) 25 MG tablet TAKE 1 TABLET BY MOUTH EVERY DAY FOR ANXIETY AND DEPRESSION. TAKE WITH 50 MG TABLET 05/04/23   Doreene Nest, NP  sertraline (ZOLOFT) 50 MG tablet TAKE 1 TABLET(50 MG) BY MOUTH DAILY FOR ANXIETY OR DEPRESSION 05/01/22   Doreene Nest, NP    Family History Family History  Problem Relation Age of Onset  Hypertension Mother    Lung cancer Mother 9   Hypothyroidism Mother    Hypertension Father    Autism Brother    Depression Brother     Social History Social History   Tobacco Use   Smoking status: Never   Smokeless tobacco: Never  Vaping Use   Vaping status: Never Used  Substance Use Topics   Alcohol use: Yes    Comment: occasionally   Drug use: No     Allergies   Lidocaine   Review of Systems Review of Systems  Respiratory:  Positive for cough.      Physical Exam Triage Vital Signs ED Triage Vitals  Encounter Vitals Group     BP 05/20/23 1236 136/81     Systolic BP Percentile --      Diastolic BP Percentile --      Pulse Rate 05/20/23 1236 76     Resp 05/20/23 1236 18     Temp 05/20/23 1236 99.1 F (37.3 C)     Temp Source 05/20/23 1236 Oral     SpO2 05/20/23 1236 94 %     Weight --      Height --      Head Circumference --      Peak Flow --      Pain Score 05/20/23 1233 5     Pain Loc  --      Pain Education --      Exclude from Growth Chart --    No data found.  Updated Vital Signs BP 136/81 (BP Location: Left Arm)   Pulse 76   Temp 99.1 F (37.3 C) (Oral)   Resp 18   LMP 04/22/2023   SpO2 94%   Visual Acuity Right Eye Distance:   Left Eye Distance:   Bilateral Distance:    Right Eye Near:   Left Eye Near:    Bilateral Near:     Physical Exam Vitals reviewed.  Constitutional:      General: She is not in acute distress.    Appearance: She is not toxic-appearing.  HENT:     Right Ear: Tympanic membrane and ear canal normal.     Left Ear: Tympanic membrane and ear canal normal.     Nose: Nose normal.     Mouth/Throat:     Mouth: Mucous membranes are moist.     Comments: There is some mild erythema of the posterior oropharynx Eyes:     Extraocular Movements: Extraocular movements intact.     Conjunctiva/sclera: Conjunctivae normal.     Pupils: Pupils are equal, round, and reactive to light.  Cardiovascular:     Rate and Rhythm: Normal rate and regular rhythm.     Heart sounds: No murmur heard. Pulmonary:     Effort: No respiratory distress.     Breath sounds: No stridor. No wheezing, rhonchi or rales.  Musculoskeletal:     Cervical back: Neck supple.  Lymphadenopathy:     Cervical: No cervical adenopathy.  Skin:    Capillary Refill: Capillary refill takes less than 2 seconds.     Coloration: Skin is not jaundiced or pale.  Neurological:     General: No focal deficit present.     Mental Status: She is alert and oriented to person, place, and time.  Psychiatric:        Behavior: Behavior normal.      UC Treatments / Results  Labs (all labs ordered are listed, but only abnormal results are displayed) Labs Reviewed -  No data to display  EKG   Radiology No results found.  Procedures Procedures (including critical care time)  Medications Ordered in UC Medications - No data to display  Initial Impression / Assessment and Plan /  UC Course  I have reviewed the triage vital signs and the nursing notes.  Pertinent labs & imaging results that were available during my care of the patient were reviewed by me and considered in my medical decision making (see chart for details).      Lung exam is normal and reassuring.  Her last EGFR was 98.  She does have a risk factor for severe COVID disease with an elevated BMI.  Paxlovid is sent to lessen her risk for severe COVID.  Also Jerilynn Som are sent in for the cough. Final Clinical Impressions(s) / UC Diagnoses   Final diagnoses:  COVID     Discharge Instructions      Take nirmatrelvir 150 mg --2 tablets twice daily for 5 days, plus also ritonavir 100 mg--1 tablet twice daily for 5 days.  These are antiviral medicines, meant to keep you from getting worse with a COVID-19 infection  Take benzonatate 100 mg, 1 tab every 8 hours as needed for cough.       ED Prescriptions     Medication Sig Dispense Auth. Provider   nirmatrelvir/ritonavir (PAXLOVID) 20 x 150 MG & 10 x 100MG  TABS Patient GFR is 98. Take nirmatrelvir (150 mg) two tablets twice daily for 5 days and ritonavir (100 mg) one tablet twice daily for 5 days. 30 tablet Marilla Boddy, Janace Aris, MD   benzonatate (TESSALON) 100 MG capsule Take 1 capsule (100 mg total) by mouth 3 (three) times daily as needed for cough. 30 capsule Zenia Resides, MD      PDMP not reviewed this encounter.   Zenia Resides, MD 05/20/23 [phone]

## 2023-05-20 NOTE — ED Triage Notes (Signed)
Patient to Urgent Care with complaints of nasal congestion/ sore throat/ cough/ fatigue/ chest congestion/ post-nasal drip.  Reports symptoms started on Monday. Positive home Covid test on Tuesday.   Reports symptoms feel like they are worsening.

## 2023-08-30 DIAGNOSIS — E039 Hypothyroidism, unspecified: Secondary | ICD-10-CM

## 2023-08-30 DIAGNOSIS — F418 Other specified anxiety disorders: Secondary | ICD-10-CM

## 2023-08-31 MED ORDER — SERTRALINE HCL 50 MG PO TABS
50.0000 mg | ORAL_TABLET | Freq: Every day | ORAL | 1 refills | Status: DC
Start: 2023-08-31 — End: 2024-03-01

## 2023-08-31 MED ORDER — LEVOTHYROXINE SODIUM 150 MCG PO TABS
ORAL_TABLET | ORAL | 1 refills | Status: DC
Start: 2023-08-31 — End: 2024-03-01

## 2024-03-01 ENCOUNTER — Other Ambulatory Visit: Payer: Self-pay

## 2024-03-01 DIAGNOSIS — E039 Hypothyroidism, unspecified: Secondary | ICD-10-CM

## 2024-03-01 DIAGNOSIS — F418 Other specified anxiety disorders: Secondary | ICD-10-CM

## 2024-03-01 MED ORDER — LEVOTHYROXINE SODIUM 150 MCG PO TABS: ORAL_TABLET | ORAL | 0 refills | Status: DC

## 2024-03-01 MED ORDER — SERTRALINE HCL 50 MG PO TABS
50.0000 mg | ORAL_TABLET | Freq: Every day | ORAL | 0 refills | Status: DC
Start: 2024-03-01 — End: 2024-05-31

## 2024-03-01 NOTE — Telephone Encounter (Signed)
 Patient is due for CPE/follow up in late August 2025, this will be required prior to any further refills.  Please schedule, thank you!

## 2024-03-02 NOTE — Telephone Encounter (Signed)
 Spoke to pt, sch cpe for 04/18/24

## 2024-04-18 ENCOUNTER — Encounter: Admitting: Primary Care

## 2024-04-19 ENCOUNTER — Telehealth: Payer: Self-pay | Admitting: Primary Care

## 2024-04-19 DIAGNOSIS — E785 Hyperlipidemia, unspecified: Secondary | ICD-10-CM

## 2024-04-19 DIAGNOSIS — E039 Hypothyroidism, unspecified: Secondary | ICD-10-CM

## 2024-04-19 NOTE — Addendum Note (Signed)
 Addended by: Ivelise Castillo K on: 04/19/2024 09:16 PM   Modules accepted: Orders

## 2024-04-19 NOTE — Telephone Encounter (Signed)
 Please call patient:  Let her know that I sent lab orders to [facility]. Needs to fast 4 hours prior.

## 2024-04-19 NOTE — Telephone Encounter (Signed)
 Contacted pt to r/s cpe on tomorrow. Pt asked if orders could be sent to [facility]? Pt states she's an employee at [employer] & their labs have to be cone at their locations. Appt r/s to 9/9

## 2024-04-20 ENCOUNTER — Encounter: Admitting: Primary Care

## 2024-04-20 NOTE — Telephone Encounter (Signed)
 Called patient and reviewed all information. Patient verbalized understanding. Will call if any further questions.

## 2024-04-27 ENCOUNTER — Ambulatory Visit: Payer: Self-pay | Admitting: Primary Care

## 2024-04-27 LAB — COMPREHENSIVE METABOLIC PANEL WITH GFR
ALT: 16 IU/L (ref 0–32)
AST: 19 IU/L (ref 0–40)
Albumin: 4.3 g/dL (ref 3.9–4.9)
Alkaline Phosphatase: 79 IU/L (ref 44–121)
BUN/Creatinine Ratio: 11 (ref 9–23)
BUN: 9 mg/dL (ref 6–20)
Bilirubin Total: 0.5 mg/dL (ref 0.0–1.2)
CO2: 18 mmol/L — ABNORMAL LOW (ref 20–29)
Calcium: 9.1 mg/dL (ref 8.7–10.2)
Chloride: 106 mmol/L (ref 96–106)
Creatinine, Ser: 0.83 mg/dL (ref 0.57–1.00)
Globulin, Total: 2.7 g/dL (ref 1.5–4.5)
Glucose: 95 mg/dL (ref 70–99)
Potassium: 4.5 mmol/L (ref 3.5–5.2)
Sodium: 138 mmol/L (ref 134–144)
Total Protein: 7 g/dL (ref 6.0–8.5)
eGFR: 92 mL/min/1.73 (ref >59)

## 2024-04-27 LAB — HEMOGLOBIN A1C
Est. average glucose Bld gHb Est-mCnc: 103 mg/dL
Hgb A1c MFr Bld: 5.2 % (ref 4.8–5.6)

## 2024-04-27 LAB — LIPID PANEL
Chol/HDL Ratio: 5.4 ratio — ABNORMAL HIGH (ref 0.0–4.4)
Cholesterol, Total: 231 mg/dL — ABNORMAL HIGH (ref 100–199)
HDL: 43 mg/dL (ref >39)
LDL Chol Calc (NIH): 162 mg/dL — ABNORMAL HIGH (ref 0–99)
Triglycerides: 145 mg/dL (ref 0–149)
VLDL Cholesterol Cal: 26 mg/dL (ref 5–40)

## 2024-04-27 LAB — TSH: TSH: 4.61 u[IU]/mL — ABNORMAL HIGH (ref 0.450–4.500)

## 2024-05-09 ENCOUNTER — Ambulatory Visit (INDEPENDENT_AMBULATORY_CARE_PROVIDER_SITE_OTHER): Admitting: Primary Care

## 2024-05-09 ENCOUNTER — Encounter: Payer: Self-pay | Admitting: Primary Care

## 2024-05-09 VITALS — BP 130/82 | HR 84 | Temp 97.8°F | Ht 68.5 in | Wt 269.0 lb

## 2024-05-09 DIAGNOSIS — Z3201 Encounter for pregnancy test, result positive: Secondary | ICD-10-CM

## 2024-05-09 DIAGNOSIS — K219 Gastro-esophageal reflux disease without esophagitis: Secondary | ICD-10-CM | POA: Diagnosis not present

## 2024-05-09 DIAGNOSIS — Z0001 Encounter for general adult medical examination with abnormal findings: Secondary | ICD-10-CM | POA: Diagnosis not present

## 2024-05-09 DIAGNOSIS — E785 Hyperlipidemia, unspecified: Secondary | ICD-10-CM | POA: Diagnosis not present

## 2024-05-09 DIAGNOSIS — E039 Hypothyroidism, unspecified: Secondary | ICD-10-CM | POA: Diagnosis not present

## 2024-05-09 DIAGNOSIS — F418 Other specified anxiety disorders: Secondary | ICD-10-CM

## 2024-05-09 NOTE — Patient Instructions (Signed)
 Stop by the lab prior to leaving today. I will notify you of your results once received.   Continue to work on improving your diet.  Start walking again.  Follow up with your OB/GYN.  It was a pleasure to see you today!

## 2024-05-09 NOTE — Addendum Note (Signed)
 Addended by: ISADORA RAISIN on: 05/09/2024 09:19 AM   Modules accepted: Orders

## 2024-05-09 NOTE — Progress Notes (Signed)
 Subjective:    Patient ID: Leeroy KANDICE Bohr, female    DOB: 09-27-1985, 38 y.o.   MRN: [id number]  KELITA WALLIS is a very pleasant 38 y.o. female who presents today for complete physical and follow up of chronic conditions.  She would also like to discuss positive pregnancy test. She's taken 4 urine pregnancy tests. Positive urine pregnancy test on 04/27/24. LMP 03/23/24. 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 She has one living child who is  years old. She has noticed breast tenderness, nausea without vomiting, fatigue, mild pelvic cramping. She denies vaginal bleeding/spotting. She is managed on a prenatal vitamin now.  Immunizations: -Tetanus: Completed in 2024 -Influenza: Declines influenza vaccine.  -HPV: Never completed  Diet: Fair diet.  Exercise: No regular exercise.  Eye exam: Completed last year.  Dental exam: Completes semi-annually    Pap Smear: Completes with GYN, UTD   BP Readings from Last 3 Encounters:  05/09/24 130/82  05/20/23 136/81  04/16/23 120/86   Wt Readings from Last 3 Encounters:  05/09/24 269 lb (122 kg)  04/16/23 264 lb 3.2 oz (119.8 kg)  04/08/22 257 lb (116.6 kg)      Review of Systems  Constitutional:  Positive for fatigue. Negative for unexpected weight change.  HENT:  Negative for rhinorrhea.   Respiratory:  Negative for cough and shortness of breath.   Cardiovascular:  Negative for chest pain.  Gastrointestinal:  Positive for nausea. Negative for constipation and diarrhea.  Genitourinary:  Positive for menstrual problem and pelvic pain. Negative for vaginal bleeding.  Musculoskeletal:  Negative for arthralgias and myalgias.  Skin:  Negative for rash.  Allergic/Immunologic: Negative for environmental allergies.  Neurological:  Negative for dizziness, numbness and headaches.  Psychiatric/Behavioral:  The patient is not nervous/anxious.          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   Social Drivers of Corporate investment banker Strain: Low Risk  (05/08/2024)   Overall Financial Resource Strain (CARDIA)    Difficulty of Paying Living Expenses: Not hard at all  Food Insecurity: No Food Insecurity (05/08/2024)   Hunger Vital Sign    Worried About Running Out of Food in the Last Year: Never true    Ran Out of Food in the Last Year: Never true  Transportation Needs: No Transportation Needs (05/08/2024)   PRAPARE - Administrator, Civil Service (Medical): No    Lack of Transportation (Non-Medical): No  Physical Activity: Sufficiently Active (05/08/2024)   Exercise Vital Sign  Days of Exercise per Week: 3 days    Minutes of Exercise per Session: 100 min  Stress: No Stress Concern Present (05/08/2024)   [employer] of Occupational Health - Occupational Stress Questionnaire    Feeling of Stress: Only a little  Social Connections: Moderately Isolated (05/08/2024)   Social Connection and Isolation Panel    Frequency of Communication with Friends and Family: Twice a week    Frequency of Social Gatherings with Friends and Family: Once a week    Attends Religious Services: Never    Database administrator or  Organizations: No    Attends Engineer, structural: Not on file    Marital Status: Married  Catering manager Violence: Not on file    Past Surgical History:  Procedure Laterality Date   APPENDECTOMY      Family History  Problem Relation Age of Onset   Hypertension Mother    Lung cancer Mother 16   Hypothyroidism Mother    Hypertension Father    Autism Brother    Depression Brother     Allergies  Allergen Reactions   Lidocaine  Other (See Comments)    Severe burning    Current Outpatient Medications on File Prior to Visit  Medication Sig Dispense Refill   levothyroxine  (SYNTHROID ) 150 MCG tablet Take 1 tablet by mouth every morning on an empty stomach with water only.  No food or other medications for 30 minutes. 90 tablet 0   sertraline  (ZOLOFT ) 50 MG tablet Take 1 tablet (50 mg total) by mouth daily. for anxiety and depression. 90 tablet 0   No current facility-administered medications on file prior to visit.    BP 130/82   Pulse 84   Temp 97.8 F (36.6 C) (Temporal)   Ht 5' 8.5 (1.74 m)   Wt 269 lb (122 kg)   LMP 03/23/2024   SpO2 98%   BMI 40.31 kg/m  Objective:   Physical Exam HENT:     Right Ear: Tympanic membrane and ear canal normal.     Left Ear: Tympanic membrane and ear canal normal.  Eyes:     Pupils: Pupils are equal, round, and reactive to light.  Cardiovascular:     Rate and Rhythm: Normal rate and regular rhythm.  Pulmonary:     Effort: Pulmonary effort is normal.     Breath sounds: Normal breath sounds.  Abdominal:     General: Bowel sounds are normal.     Palpations: Abdomen is soft.     Tenderness: There is no abdominal tenderness.  Musculoskeletal:        General: Normal range of motion.     Cervical back: Neck supple.  Skin:    General: Skin is warm and dry.  Neurological:     Mental Status: She is alert and oriented to person, place, and time.     Cranial Nerves: No cranial nerve deficit.     Deep Tendon Reflexes:      Reflex Scores:      Patellar reflexes are 2+ on the right side and 2+ on the left side. Psychiatric:        Mood and Affect: Mood normal.     Physical Exam        Assessment & Plan:  Encounter for annual general medical examination with abnormal findings in adult Assessment & Plan: Immunizations UTD. Declines influenza vaccine. Discussed HPV vaccines.  Pap smear UTD. Follows with GYN  Discussed the importance of a healthy diet and regular  exercise in order for weight loss, and to reduce the risk of further co-morbidity.  Exam stable. Labs reviewed.  Follow up in 1 year for repeat physical.    Gastroesophageal reflux disease, unspecified whether esophagitis present Assessment & Plan: Controlled.  Continue famotidine 20 mg PRN   Hypothyroidism, unspecified type Assessment & Plan: She is taking levothyroxine  correctly. Continue levothyroxine  150 mcg daily.  Repeat TSH in 1 month. Or GYN will manage.    Hyperlipidemia, unspecified hyperlipidemia type Assessment & Plan: Above goal, reviewed with patient.  No FH of CAD. She will work on lifestyle changes.   Continue to monitor.    Depression with anxiety Assessment & Plan: Improved and stable.   Continue on Zoloft  50 mg daily.  She weaned off Zoloft  25 mg safely and feels well.    Positive urine pregnancy test Assessment & Plan: HCG blood draw pending.   She will set up an appointment with GYN  Orders: -     hCG, quantitative, pregnancy    Assessment and Plan Assessment & Plan         Comer MARLA Gaskins, NP     History of Present Illness

## 2024-05-09 NOTE — Assessment & Plan Note (Signed)
 HCG blood draw pending.   She will set up an appointment with GYN

## 2024-05-09 NOTE — Assessment & Plan Note (Signed)
 Controlled.  Continue famotidine 20 mg PRN.

## 2024-05-09 NOTE — Assessment & Plan Note (Signed)
 Improved and stable.   Continue on Zoloft  50 mg daily.  She weaned off Zoloft  25 mg safely and feels well.

## 2024-05-09 NOTE — Assessment & Plan Note (Addendum)
 She is taking levothyroxine  correctly. Continue levothyroxine  150 mcg daily.  Repeat TSH in 1 month. Or GYN will manage.

## 2024-05-09 NOTE — Assessment & Plan Note (Addendum)
 Immunizations UTD. Declines influenza vaccine. Discussed HPV vaccines.  Pap smear UTD. Follows with GYN  Discussed the importance of a healthy diet and regular exercise in order for weight loss, and to reduce the risk of further co-morbidity.  Exam stable. Labs reviewed.  Follow up in 1 year for repeat physical.

## 2024-05-09 NOTE — Assessment & Plan Note (Addendum)
 Above goal, reviewed with patient.  No FH of CAD. She will work on lifestyle changes.   Continue to monitor.

## 2024-05-10 ENCOUNTER — Ambulatory Visit: Payer: Self-pay | Admitting: Primary Care

## 2024-05-10 LAB — BETA HCG QUANT (REF LAB): hCG Quant: 11037 m[IU]/mL

## 2024-05-15 ENCOUNTER — Encounter: Payer: Self-pay | Admitting: Primary Care

## 2024-05-31 ENCOUNTER — Other Ambulatory Visit: Payer: Self-pay

## 2024-05-31 DIAGNOSIS — F418 Other specified anxiety disorders: Secondary | ICD-10-CM

## 2024-05-31 DIAGNOSIS — E039 Hypothyroidism, unspecified: Secondary | ICD-10-CM

## 2024-06-01 MED ORDER — LEVOTHYROXINE SODIUM 150 MCG PO TABS: ORAL_TABLET | ORAL | 0 refills | Status: AC

## 2024-06-01 MED ORDER — SERTRALINE HCL 50 MG PO TABS: 50.0000 mg | ORAL_TABLET | Freq: Every day | ORAL | 2 refills | Status: AC

## 2024-06-01 NOTE — Telephone Encounter (Signed)
 Please call patient:  Needs repeat TSH, lab only appt is fine. OR is GYN refilling and managing her levothyroxine  pill?

## 2024-06-02 NOTE — Telephone Encounter (Signed)
 Called and spoke with patient, states she has appt with OBGYN next week where they will be taking over managing her levothyroxine . States she will have TSH labs checked next week at that appt.

## 2024-10-05 ENCOUNTER — Ambulatory Visit
Admission: EM | Admit: 2024-10-05 | Discharge: 2024-10-05 | Disposition: A | Discharge status: Home / Self Care | Source: Home / Self Care

## 2024-10-05 ENCOUNTER — Encounter: Payer: Self-pay | Admitting: Emergency Medicine

## 2024-10-05 DIAGNOSIS — B349 Viral infection, unspecified: Secondary | ICD-10-CM | POA: Diagnosis not present

## 2024-10-05 DIAGNOSIS — J101 Influenza due to other identified influenza virus with other respiratory manifestations: Secondary | ICD-10-CM | POA: Diagnosis not present

## 2024-10-05 LAB — POC COVID19/FLU A&B COMBO
Covid Antigen, POC: NEGATIVE
Influenza A Antigen, POC: NEGATIVE
Influenza B Antigen, POC: POSITIVE — AB

## 2024-10-05 MED ORDER — BENZONATATE 100 MG PO CAPS: 100.0000 mg | ORAL_CAPSULE | Freq: Three times a day (TID) | ORAL | 0 refills | Status: AC

## 2024-10-05 MED ORDER — PROMETHAZINE-DM 6.25-15 MG/5ML PO SYRP: 5.0000 mL | ORAL_SOLUTION | Freq: Every evening | ORAL | 0 refills | Status: AC | PRN

## 2024-10-05 NOTE — Discharge Instructions (Addendum)
 Influenza B is a virus and should steadily improve in time it can take up to 7 to 10 days before you truly start to see a turnaround however things will get better  Unfortunately as this is day 4 of your illness we are unable to use Tamiflu  however despite this you will ideally continue to get better with time  You may use Tessalon  pill every 8 hours as needed for coughing you may use cough syrup at bedtime to help you rest as needed  Will need to quarantine until without fever for 24 hours.,  If no fever may continue activity wearing mask for 5 days from the start of your symptoms    You can take Tylenol  and/or Ibuprofen  as needed for fever reduction and pain relief.   For cough: honey 1/2 to 1 teaspoon (you can dilute the honey in water or another fluid).  You can also use guaifenesin and dextromethorphan for cough. You can use a humidifier for chest congestion and cough.  If you don't have a humidifier, you can sit in the bathroom with the hot shower running.      For sore throat: try warm salt water gargles, cepacol lozenges, throat spray, warm tea or water with lemon/honey, popsicles or ice, or OTC cold relief medicine for throat discomfort.   For congestion: take a daily anti-histamine like Zyrtec, Claritin, and a oral decongestant, such as pseudoephedrine.  You can also use Flonase  1-2 sprays in each nostril daily.   It is important to stay hydrated: drink plenty of fluids (water, gatorade/powerade/pedialyte, juices, or teas) to keep your throat moisturized and help further relieve irritation/discomfort.

## 2024-10-05 NOTE — ED Triage Notes (Signed)
 Patient reports sore throat, fatigued, cough with green mucus and sneezing x 3 days. Patient has taken Nyquil and Dayquil mild relief. Rates sore throat pain 5/10.

## 2024-10-05 NOTE — ED Provider Notes (Signed)
 " Holly Singleton    CSN: [id number] Arrival date & time: 10/05/24  1351      History   Chief Complaint Chief Complaint  Patient presents with   Sore Throat   Fatigue   Cough    HPI Holly Singleton is a 39 y.o. female.   Patient presents for evaluation of nasal congestion, sore throat, productive cough with green sputum present for 3 days.  Accompanying sneezing.  Experienced mild diarrhea 1 day ago unsure if related, has resolved.  Experiencing increased fatigue.  Tolerable to food and liquids.  Recent travel 2 weeks ago, husband experiencing a mild cough but no further symptoms.  Has attempted use of DayQuil and NyQuil and zinc.  Denies respiratory history, non-smoker.    Past Medical History:  Diagnosis Date   Anxiety    Depression    Gestational HTN 10/07/2017   Hypothyroidism    Morbid obesity (HCC)    Preeclampsia, third trimester 10/07/2017    Patient Active Problem List   Diagnosis Date Noted   GERD (gastroesophageal reflux disease) 03/22/2020   Encounter for annual general medical examination with abnormal findings in adult 03/16/2019   Depression with anxiety 02/28/2018   Positive urine pregnancy test 02/09/2017   Contraception management 06/05/2015   Heavy menses 04/19/2013   HLD (hyperlipidemia) 01/06/2010   Benign neoplasm of skin 12/16/2009   Hypothyroidism 06/17/2007    Past Surgical History:  Procedure Laterality Date   APPENDECTOMY      OB History     Gravida  1   Para  1   Term  1   Preterm  0   AB  0   Living  1      SAB  0   IAB  0   Ectopic  0   Multiple  0   Live Births  1            Home Medications    Prior to Admission medications  Medication Sig Start Date End Date Taking? Authorizing Provider  levothyroxine  (SYNTHROID ) 150 MCG tablet Take 1 tablet by mouth every morning on an empty stomach with water only.  No food or other medications for 30 minutes. 06/01/24   Clark, Katherine K, NP  sertraline  (ZOLOFT )  50 MG tablet Take 1 tablet (50 mg total) by mouth daily. for anxiety and depression. 06/01/24   Gretta Comer POUR, NP    Family History Family History  Problem Relation Age of Onset   Hypertension Mother    Lung cancer Mother 28   Hypothyroidism Mother    Hypertension Father    Autism Brother    Depression Brother     Social History Social History[1]   Allergies   Lidocaine    Review of Systems Review of Systems  Constitutional: Negative.   HENT:  Positive for congestion and sore throat. Negative for dental problem, drooling, ear discharge, ear pain, facial swelling, hearing loss, mouth sores, nosebleeds, postnasal drip, rhinorrhea, sinus pressure, sinus pain, sneezing, tinnitus, trouble swallowing and voice change.   Respiratory:  Positive for cough. Negative for apnea, choking, chest tightness, shortness of breath, wheezing and stridor.   Gastrointestinal:  Positive for diarrhea. Negative for abdominal distention, abdominal pain, anal bleeding, blood in stool, constipation, nausea, rectal pain and vomiting.     Physical Exam Triage Vital Signs ED Triage Vitals  Encounter Vitals Group     BP 10/05/24 1502 122/82     Girls Systolic BP Percentile --  Girls Diastolic BP Percentile --      Boys Systolic BP Percentile --      Boys Diastolic BP Percentile --      Pulse Rate 10/05/24 1502 75     Resp 10/05/24 1502 19     Temp 10/05/24 1502 98.2 F (36.8 C)     Temp Source 10/05/24 1502 Oral     SpO2 10/05/24 1502 96 %     Weight --      Height --      Head Circumference --      Peak Flow --      Pain Score 10/05/24 1459 5     Pain Loc --      Pain Education --      Exclude from Growth Chart --    No data found.  Updated Vital Signs BP 122/82 (BP Location: Right Arm)   Pulse 75   Temp 98.2 F (36.8 C) (Oral)   Resp 19   LMP 09/29/2024 (Exact Date)   SpO2 96%   Visual Acuity Right Eye Distance:   Left Eye Distance:   Bilateral Distance:    Right Eye  Near:   Left Eye Near:    Bilateral Near:     Physical Exam Constitutional:      Appearance: Normal appearance.  HENT:     Right Ear: Tympanic membrane, ear canal and external ear normal.     Left Ear: Tympanic membrane, ear canal and external ear normal.     Nose: Congestion present.     Mouth/Throat:     Pharynx: No oropharyngeal exudate or posterior oropharyngeal erythema.  Eyes:     Extraocular Movements: Extraocular movements intact.  Cardiovascular:     Rate and Rhythm: Normal rate and regular rhythm.     Pulses: Normal pulses.     Heart sounds: Normal heart sounds.  Pulmonary:     Effort: Pulmonary effort is normal.     Breath sounds: Normal breath sounds.  Musculoskeletal:     Cervical back: Normal range of motion.  Lymphadenopathy:     Cervical: Cervical adenopathy present.  Skin:    General: Skin is warm and dry.  Neurological:     Mental Status: She is alert and oriented to person, place, and time. Mental status is at baseline.      UC Treatments / Results  Labs (all labs ordered are listed, but only abnormal results are displayed) Labs Reviewed  POC COVID19/FLU A&B COMBO    EKG   Radiology No results found.  Procedures Procedures (including critical care time)  Medications Ordered in UC Medications - No data to display  Initial Impression / Assessment and Plan / UC Course  I have reviewed the triage vital signs and the nursing notes.  Pertinent labs & imaging results that were available during my care of the patient were reviewed by me and considered in my medical decision making (see chart for details).  Viral illness, influenza B  Patient is in no signs of distress nor toxic appearing.  Vital signs are stable.  Low suspicion for pneumonia, pneumothorax or bronchitis and therefore will defer imaging.  Negative for COVID.  Symptoms present for 4 days, past window for Tamiflu , discussed this with patient.  Discussed quarantining if with fever.   Prescribed Tessalon  and Promethazine  DM. May use additional over-the-counter medications as needed for supportive care.  May follow-up with urgent care as needed if symptoms persist or worsen.  Final Clinical Impressions(s) / UC  Diagnoses   Final diagnoses:  Viral illness   Discharge Instructions   None    ED Prescriptions   None    PDMP not reviewed this encounter.     [1]  Social History Tobacco Use   Smoking status: Never   Smokeless tobacco: Never  Vaping Use   Vaping status: Never Used  Substance Use Topics   Alcohol use: Yes    Comment: occasionally   Drug use: No     Teresa Shelba SAUNDERS, NP 10/05/24 1535  "
